# Patient Record
Sex: Female | Born: 1970 | Race: Black or African American | Hispanic: No | Marital: Single | Smoking: Never smoker
Health system: Southern US, Community
[De-identification: ages and names within clinical notes are randomized; demographics above are authoritative.]

## PROBLEM LIST (undated history)

## (undated) DIAGNOSIS — D219 Benign neoplasm of connective and other soft tissue, unspecified: Secondary | ICD-10-CM

## (undated) DIAGNOSIS — I341 Nonrheumatic mitral (valve) prolapse: Secondary | ICD-10-CM

## (undated) DIAGNOSIS — N920 Excessive and frequent menstruation with regular cycle: Secondary | ICD-10-CM

## (undated) HISTORY — PX: WISDOM TOOTH EXTRACTION: SHX21

## (undated) HISTORY — DX: Nonrheumatic mitral (valve) prolapse: I34.1

## (undated) HISTORY — PX: COLONOSCOPY: SHX174

## (undated) HISTORY — DX: Benign neoplasm of connective and other soft tissue, unspecified: D21.9

## (undated) HISTORY — DX: Excessive and frequent menstruation with regular cycle: N92.0

---

## 2012-07-17 LAB — HM COLONOSCOPY

## 2015-04-26 ENCOUNTER — Ambulatory Visit (INDEPENDENT_AMBULATORY_CARE_PROVIDER_SITE_OTHER): Payer: BLUE CROSS/BLUE SHIELD | Admitting: Family Medicine

## 2015-04-26 ENCOUNTER — Encounter: Payer: Self-pay | Admitting: Family Medicine

## 2015-04-26 VITALS — BP 108/66 | HR 98 | Temp 98.8°F | Resp 16 | Ht 70.0 in | Wt 158.3 lb

## 2015-04-26 DIAGNOSIS — H02823 Cysts of right eye, unspecified eyelid: Secondary | ICD-10-CM

## 2015-04-26 DIAGNOSIS — N920 Excessive and frequent menstruation with regular cycle: Secondary | ICD-10-CM | POA: Insufficient documentation

## 2015-04-26 DIAGNOSIS — Z0000 Encounter for general adult medical examination without abnormal findings: Secondary | ICD-10-CM | POA: Diagnosis not present

## 2015-04-26 DIAGNOSIS — M549 Dorsalgia, unspecified: Secondary | ICD-10-CM | POA: Insufficient documentation

## 2015-04-26 DIAGNOSIS — H02829 Cysts of unspecified eye, unspecified eyelid: Secondary | ICD-10-CM | POA: Insufficient documentation

## 2015-04-26 NOTE — Progress Notes (Signed)
Name: Abigail Massey   MRN: [id number]    DOB: [birth year]   Date:04/26/2015       Progress Note  Subjective  Chief Complaint  Chief Complaint  Patient presents with  . Establish Care    HPI  Pt. Is here for an annual physical without GYN exam.  She is doing well, no concerns voiced today.  Past Medical History  Diagnosis Date  . Mitral prolapse   . Fibroids   . Heavy menstrual bleeding     Past Surgical History  Procedure Laterality Date  . Colonoscopy    . Wisdom tooth extraction Bilateral     Family History  Problem Relation Age of Onset  . Glaucoma Mother   . Hypothyroidism Mother   . Healthy Father   . Healthy Sister     Social History   Social History  . Marital Status: Single    Spouse Name: N/A  . Number of Children: N/A  . Years of Education: N/A   Occupational History  . Not on file.   Social History Main Topics  . Smoking status: Never Smoker   . Smokeless tobacco: Not on file  . Alcohol Use: No  . Drug Use: No  . Sexual Activity: No   Other Topics Concern  . Not on file   Social History Narrative  . No narrative on file    Current outpatient prescriptions:  .  ascorbic acid (VITAMIN C) 500 MG tablet, Take by mouth., Disp: , Rfl:  .  cyanocobalamin 1000 MCG tablet, Take by mouth., Disp: , Rfl:  .  ferrous sulfate (IRON SUPPLEMENT) 325 (65 FE) MG tablet, Take by mouth., Disp: , Rfl:  .  Multiple Vitamins-Minerals (MULTIVITAMIN WOMEN) TABS, Take by mouth., Disp: , Rfl:  .  Probiotic Product (SOLUBLE FIBER/PROBIOTICS) CHEW, Chew by mouth., Disp: , Rfl:  .  Vitamin D, Cholecalciferol, 1000 UNITS TABS, Take by mouth., Disp: , Rfl:   No Known Allergies   Review of Systems  Constitutional: Positive for malaise/fatigue. Negative for fever, chills and weight loss.  HENT: Positive for congestion (sinus congestion.). Negative for ear pain and sore throat.   Eyes: Negative for blurred vision and double vision.  Respiratory: Negative for  cough, sputum production and shortness of breath.   Cardiovascular: Negative for chest pain, palpitations and leg swelling.  Gastrointestinal: Negative for heartburn, nausea, vomiting, abdominal pain, diarrhea, constipation and blood in stool (pt. has history of internal hemorrhoids.).  Genitourinary: Positive for frequency. Negative for dysuria, urgency and hematuria.  Musculoskeletal: Negative for myalgias, back pain, joint pain and neck pain.  Skin: Negative for itching and rash.  Neurological: Negative for dizziness, sensory change and headaches.  Endo/Heme/Allergies: Does not bruise/bleed easily.  Psychiatric/Behavioral: Negative for depression. The patient is not nervous/anxious and does not have insomnia.    Objective  Filed Vitals:   04/26/15 1027  BP: 108/66  Pulse: 98  Temp: 98.8 F (37.1 C)  TempSrc: Oral  Resp: 16  Height: 5\' 10"  (1.778 m)  Weight: 158 lb 4.8 oz (71.804 kg)  SpO2: 98%   Physical Exam  Constitutional: She is oriented to person, place, and time and well-developed, well-nourished, and in no distress.  HENT:  Head: Normocephalic and atraumatic.  Nasal turbinates erythematous and hypertrophied.  Eyes: Conjunctivae and lids are normal. Pupils are equal, round, and reactive to light.  Mass on the lateral right lower eyelid, present for over 6 months.  Neck: Normal range of motion. Neck supple.  Cardiovascular:  Normal rate and regular rhythm.   No murmur heard. Pulmonary/Chest: Effort normal and breath sounds normal. She has no wheezes.  Abdominal: Soft. Bowel sounds are normal. There is no tenderness.  Genitourinary:  Deferred. Follows up with GYN.  Neurological: She is alert and oriented to person, place, and time.  Skin: Skin is warm and dry.  Psychiatric: Mood, memory, affect and judgment normal.  Nursing note and vitals reviewed.  Assessment & Plan  1. Annual physical exam Pain screening lab work. Patient  Follows up with gynecology for Pap  smear. - CBC with Differential - Comprehensive Metabolic Panel (CMET) - TSH - Vitamin D (25 hydroxy) - Lipid Profile  2. Eyelid cyst, right  - Ambulatory referral to Ophthalmology    [facility] 04/26/2015 11:15 AM

## 2015-04-30 LAB — CBC WITH DIFFERENTIAL/PLATELET
Basophils Absolute: 0 10*3/uL (ref 0.0–0.2)
Basos: 0 %
EOS (ABSOLUTE): 0.1 10*3/uL (ref 0.0–0.4)
Eos: 3 %
Hematocrit: 38.8 % (ref 34.0–46.6)
Hemoglobin: 12.7 g/dL (ref 11.1–15.9)
Immature Grans (Abs): 0 10*3/uL (ref 0.0–0.1)
Immature Granulocytes: 0 %
Lymphocytes Absolute: 1 10*3/uL (ref 0.7–3.1)
Lymphs: 20 %
MCH: 27.2 pg (ref 26.6–33.0)
MCHC: 32.7 g/dL (ref 31.5–35.7)
MCV: 83 fL (ref 79–97)
Monocytes Absolute: 0.5 10*3/uL (ref 0.1–0.9)
Monocytes: 10 %
Neutrophils Absolute: 3.5 10*3/uL (ref 1.4–7.0)
Neutrophils: 67 %
Platelets: 259 10*3/uL (ref 150–379)
RBC: 4.67 x10E6/uL (ref 3.77–5.28)
RDW: 13.6 % (ref 12.3–15.4)
WBC: 5.2 10*3/uL (ref 3.4–10.8)

## 2015-04-30 LAB — COMPREHENSIVE METABOLIC PANEL
ALT: 26 IU/L (ref 0–32)
AST: 22 IU/L (ref 0–40)
Albumin/Globulin Ratio: 1.7 (ref 1.1–2.5)
Albumin: 4.3 g/dL (ref 3.5–5.5)
Alkaline Phosphatase: 54 IU/L (ref 39–117)
BUN/Creatinine Ratio: 11 (ref 9–23)
BUN: 9 mg/dL (ref 6–24)
Bilirubin Total: 0.5 mg/dL (ref 0.0–1.2)
CO2: 22 mmol/L (ref 18–29)
Calcium: 8.8 mg/dL (ref 8.7–10.2)
Chloride: 102 mmol/L (ref 97–106)
Creatinine, Ser: 0.83 mg/dL (ref 0.57–1.00)
GFR calc Af Amer: 101 mL/min/{1.73_m2} (ref >59)
GFR calc non Af Amer: 87 mL/min/{1.73_m2} (ref >59)
Globulin, Total: 2.6 g/dL (ref 1.5–4.5)
Glucose: 77 mg/dL (ref 65–99)
Potassium: 4.3 mmol/L (ref 3.5–5.2)
Sodium: 139 mmol/L (ref 136–144)
Total Protein: 6.9 g/dL (ref 6.0–8.5)

## 2015-04-30 LAB — VITAMIN D 25 HYDROXY (VIT D DEFICIENCY, FRACTURES): Vit D, 25-Hydroxy: 30.3 ng/mL (ref 30.0–100.0)

## 2015-04-30 LAB — LIPID PANEL
Chol/HDL Ratio: 2.9 ratio units (ref 0.0–4.4)
Cholesterol, Total: 138 mg/dL (ref 100–199)
HDL: 47 mg/dL (ref >39)
LDL Calculated: 81 mg/dL (ref 0–99)
Triglycerides: 52 mg/dL (ref 0–149)
VLDL Cholesterol Cal: 10 mg/dL (ref 5–40)

## 2015-04-30 LAB — TSH: TSH: 1.13 u[IU]/mL (ref 0.450–4.500)

## 2015-05-10 MED ORDER — FAMOTIDINE 20 MG PO TABS
ORAL_TABLET | ORAL | Status: AC
  Filled 2015-05-10: qty 1

## 2015-05-10 MED ORDER — CEFAZOLIN SODIUM-DEXTROSE 2-3 GM-% IV SOLR
INTRAVENOUS | Status: AC
  Filled 2015-05-10: qty 50

## 2015-05-13 ENCOUNTER — Encounter
Admission: RE | Admit: 2015-05-13 | Discharge: 2015-05-13 | Disposition: A | Discharge status: Home / Self Care | Payer: BLUE CROSS/BLUE SHIELD | Source: Ambulatory Visit | Attending: Obstetrics & Gynecology | Admitting: Obstetrics & Gynecology

## 2015-05-13 DIAGNOSIS — Z01812 Encounter for preprocedural laboratory examination: Secondary | ICD-10-CM | POA: Insufficient documentation

## 2015-05-13 LAB — TYPE AND SCREEN
ABO/RH(D): A POS
Antibody Screen: NEGATIVE

## 2015-05-13 LAB — ABO/RH: ABO/RH(D): A POS

## 2015-05-13 NOTE — Patient Instructions (Signed)
  Your procedure is scheduled on:05/18/15 Report to Day Surgery. To find out your arrival time please call [phone] between 1PM - 3PM on 05/17/15.  Remember: Instructions that are not followed completely may result in serious medical risk, up to and including death, or upon the discretion of your surgeon and anesthesiologist your surgery may need to be rescheduled.    _x___ 1. Do not eat food or drink liquids after midnight. No gum chewing or hard candies.     __x_ 2. No Alcohol for 24 hours before or after surgery.   ____ 3. Bring all medications with you on the day of surgery if instructed.    __x__ 4. Notify your doctor if there is any change in your medical condition     (cold, fever, infections).     Do not wear jewelry, make-up, hairpins, clips or nail polish.  Do not wear lotions, powders, or perfumes. You may wear deodorant.  Do not shave 48 hours prior to surgery. Men may shave face and neck.  Do not bring valuables to the hospital.    [facility] is not responsible for any belongings or valuables.               Contacts, dentures or bridgework may not be worn into surgery.  Leave your suitcase in the car. After surgery it may be brought to your room.  For patients admitted to the hospital, discharge time is determined by your                treatment team.   Patients discharged the day of surgery will not be allowed to drive home.   Please read over the following fact sheets that you were given:   Surgical Site Infection Prevention   ____ Take these medicines the morning of surgery with A SIP OF WATER:    1.   2.   3.   4.  5.  6.  ____ Fleet Enema (as directed)   _x___ Use CHG Soap as directed  ____ Use inhalers on the day of surgery  ____ Stop metformin 2 days prior to surgery    ____ Take 1/2 of usual insulin dose the night before surgery and none on the morning of surgery.   ____ Stop Coumadin/Plavix/aspirin on   _x___ Stop Anti-inflammatories on  today   _x___ Stop supplements until after surgery.  Vit C today  ____ Bring C-Pap to the hospital.

## 2015-05-18 ENCOUNTER — Ambulatory Visit: Payer: BLUE CROSS/BLUE SHIELD | Admitting: Anesthesiology

## 2015-05-18 ENCOUNTER — Encounter
Admission: RE | Disposition: A | Discharge status: Home / Self Care | Payer: Self-pay | Source: Ambulatory Visit | Attending: Obstetrics & Gynecology

## 2015-05-18 ENCOUNTER — Encounter: Payer: Self-pay | Admitting: *Deleted

## 2015-05-18 ENCOUNTER — Observation Stay
Admission: RE | Admit: 2015-05-18 | Discharge: 2015-05-19 | Disposition: A | Discharge status: Home / Self Care | Payer: BLUE CROSS/BLUE SHIELD | Source: Ambulatory Visit | Attending: Obstetrics & Gynecology | Admitting: Obstetrics & Gynecology

## 2015-05-18 DIAGNOSIS — D259 Leiomyoma of uterus, unspecified: Principal | ICD-10-CM | POA: Diagnosis present

## 2015-05-18 DIAGNOSIS — Z9889 Other specified postprocedural states: Secondary | ICD-10-CM | POA: Insufficient documentation

## 2015-05-18 DIAGNOSIS — N920 Excessive and frequent menstruation with regular cycle: Secondary | ICD-10-CM | POA: Diagnosis present

## 2015-05-18 DIAGNOSIS — N921 Excessive and frequent menstruation with irregular cycle: Secondary | ICD-10-CM | POA: Diagnosis present

## 2015-05-18 DIAGNOSIS — Z90710 Acquired absence of both cervix and uterus: Secondary | ICD-10-CM | POA: Diagnosis present

## 2015-05-18 DIAGNOSIS — N72 Inflammatory disease of cervix uteri: Secondary | ICD-10-CM | POA: Diagnosis not present

## 2015-05-18 DIAGNOSIS — N888 Other specified noninflammatory disorders of cervix uteri: Secondary | ICD-10-CM | POA: Insufficient documentation

## 2015-05-18 DIAGNOSIS — Z79899 Other long term (current) drug therapy: Secondary | ICD-10-CM | POA: Diagnosis not present

## 2015-05-18 HISTORY — PX: LAPAROSCOPIC HYSTERECTOMY: SHX1926

## 2015-05-18 HISTORY — PX: ABDOMINAL HYSTERECTOMY: SHX81

## 2015-05-18 LAB — POCT PREGNANCY, URINE: Preg Test, Ur: NEGATIVE

## 2015-05-18 SURGERY — HYSTERECTOMY, TOTAL, LAPAROSCOPIC
Anesthesia: General

## 2015-05-18 MED ORDER — LIDOCAINE HCL (CARDIAC) 20 MG/ML IV SOLN
INTRAVENOUS | Status: DC | PRN
  Administered 2015-05-18: 80 mg via INTRAVENOUS

## 2015-05-18 MED ORDER — OXYCODONE-ACETAMINOPHEN 5-325 MG PO TABS: 1.0000 | ORAL_TABLET | ORAL | Status: DC | PRN

## 2015-05-18 MED ORDER — ACETAMINOPHEN 10 MG/ML IV SOLN
INTRAVENOUS | Status: AC
  Filled 2015-05-18: qty 100

## 2015-05-18 MED ORDER — ONDANSETRON HCL 4 MG/2ML IJ SOLN: 4.0000 mg | Freq: Once | INTRAMUSCULAR | Status: DC | PRN

## 2015-05-18 MED ORDER — KETOROLAC TROMETHAMINE 30 MG/ML IJ SOLN
INTRAMUSCULAR | Status: AC
  Administered 2015-05-18: 30 mg via INTRAVENOUS
  Filled 2015-05-18: qty 1

## 2015-05-18 MED ORDER — LACTATED RINGERS IV SOLN
INTRAVENOUS | Status: DC
  Administered 2015-05-18 (×2): via INTRAVENOUS

## 2015-05-18 MED ORDER — FAMOTIDINE 20 MG PO TABS
ORAL_TABLET | ORAL | Status: AC
  Filled 2015-05-18: qty 1

## 2015-05-18 MED ORDER — BUPIVACAINE HCL (PF) 0.5 % IJ SOLN
INTRAMUSCULAR | Status: DC | PRN
  Administered 2015-05-18: 13 mL

## 2015-05-18 MED ORDER — KETOROLAC TROMETHAMINE 30 MG/ML IJ SOLN
30.0000 mg | Freq: Four times a day (QID) | INTRAMUSCULAR | Status: DC
Start: 2015-05-18 — End: 2015-05-19
  Administered 2015-05-18 – 2015-05-19 (×3): 30 mg via INTRAVENOUS
  Filled 2015-05-18 (×3): qty 1

## 2015-05-18 MED ORDER — BUPIVACAINE HCL (PF) 0.5 % IJ SOLN
INTRAMUSCULAR | Status: AC
  Filled 2015-05-18: qty 30

## 2015-05-18 MED ORDER — SIMETHICONE 80 MG PO CHEW: 80.0000 mg | CHEWABLE_TABLET | Freq: Four times a day (QID) | ORAL | Status: DC | PRN

## 2015-05-18 MED ORDER — DOCUSATE SODIUM 100 MG PO CAPS
100.0000 mg | ORAL_CAPSULE | Freq: Two times a day (BID) | ORAL | Status: DC
  Administered 2015-05-18 – 2015-05-19 (×2): 100 mg via ORAL
  Filled 2015-05-18 (×2): qty 1

## 2015-05-18 MED ORDER — KETOROLAC TROMETHAMINE 30 MG/ML IJ SOLN
30.0000 mg | Freq: Four times a day (QID) | INTRAMUSCULAR | Status: DC
  Administered 2015-05-18: 30 mg via INTRAVENOUS

## 2015-05-18 MED ORDER — FAMOTIDINE 20 MG PO TABS
20.0000 mg | ORAL_TABLET | Freq: Once | ORAL | Status: AC
  Administered 2015-05-18: 20 mg via ORAL

## 2015-05-18 MED ORDER — CEFOXITIN SODIUM-DEXTROSE 2-2.2 GM-% IV SOLR (PREMIX)
2.0000 g | Freq: Once | INTRAVENOUS | Status: AC
  Administered 2015-05-18: 2000 mg via INTRAVENOUS

## 2015-05-18 MED ORDER — FENTANYL CITRATE (PF) 250 MCG/5ML IJ SOLN
INTRAMUSCULAR | Status: DC | PRN
Start: 2015-05-18 — End: 2015-05-18
  Administered 2015-05-18: 50 ug via INTRAVENOUS
  Administered 2015-05-18 (×2): 100 ug via INTRAVENOUS
  Administered 2015-05-18 (×2): 50 ug via INTRAVENOUS

## 2015-05-18 MED ORDER — ONDANSETRON HCL 4 MG/2ML IJ SOLN: 4.0000 mg | Freq: Four times a day (QID) | INTRAMUSCULAR | Status: DC | PRN

## 2015-05-18 MED ORDER — SUGAMMADEX SODIUM 200 MG/2ML IV SOLN
INTRAVENOUS | Status: DC | PRN
  Administered 2015-05-18: 140.6 mg via INTRAVENOUS

## 2015-05-18 MED ORDER — MIDAZOLAM HCL 5 MG/5ML IJ SOLN
INTRAMUSCULAR | Status: DC | PRN
Start: 2015-05-18 — End: 2015-05-18
  Administered 2015-05-18: 2 mg via INTRAVENOUS

## 2015-05-18 MED ORDER — ONDANSETRON HCL 4 MG PO TABS: 4.0000 mg | ORAL_TABLET | Freq: Four times a day (QID) | ORAL | Status: DC | PRN

## 2015-05-18 MED ORDER — DEXAMETHASONE SODIUM PHOSPHATE 10 MG/ML IJ SOLN
INTRAMUSCULAR | Status: DC | PRN
  Administered 2015-05-18: 10 mg via INTRAVENOUS

## 2015-05-18 MED ORDER — LACTATED RINGERS IV SOLN
INTRAVENOUS | Status: DC
  Administered 2015-05-18 (×2): via INTRAVENOUS

## 2015-05-18 MED ORDER — FENTANYL CITRATE (PF) 100 MCG/2ML IJ SOLN
25.0000 ug | INTRAMUSCULAR | Status: DC | PRN
  Administered 2015-05-18 (×3): 25 ug via INTRAVENOUS

## 2015-05-18 MED ORDER — ONDANSETRON HCL 4 MG/2ML IJ SOLN
INTRAMUSCULAR | Status: DC | PRN
  Administered 2015-05-18: 4 mg via INTRAVENOUS

## 2015-05-18 MED ORDER — FENTANYL CITRATE (PF) 100 MCG/2ML IJ SOLN
INTRAMUSCULAR | Status: AC
  Administered 2015-05-18: 25 ug via INTRAVENOUS
  Filled 2015-05-18: qty 2

## 2015-05-18 MED ORDER — CEFOXITIN SODIUM-DEXTROSE 2-2.2 GM-% IV SOLR (PREMIX)
INTRAVENOUS | Status: AC
  Administered 2015-05-18: 2 g via INTRAVENOUS
  Filled 2015-05-18: qty 50

## 2015-05-18 MED ORDER — MORPHINE SULFATE (PF) 2 MG/ML IV SOLN: 1.0000 mg | INTRAVENOUS | Status: DC | PRN

## 2015-05-18 MED ORDER — ACETAMINOPHEN 10 MG/ML IV SOLN
INTRAVENOUS | Status: DC | PRN
Start: 2015-05-18 — End: 2015-05-18
  Administered 2015-05-18: 1000 mg via INTRAVENOUS

## 2015-05-18 MED ORDER — ACETAMINOPHEN 325 MG PO TABS: 650.0000 mg | ORAL_TABLET | ORAL | Status: DC | PRN

## 2015-05-18 MED ORDER — ROCURONIUM BROMIDE 100 MG/10ML IV SOLN
INTRAVENOUS | Status: DC | PRN
  Administered 2015-05-18: 20 mg via INTRAVENOUS
  Administered 2015-05-18: 50 mg via INTRAVENOUS

## 2015-05-18 MED ORDER — PROPOFOL 10 MG/ML IV BOLUS
INTRAVENOUS | Status: DC | PRN
  Administered 2015-05-18: 150 mg via INTRAVENOUS

## 2015-05-18 SURGICAL SUPPLY — 55 items
BAG URO DRAIN 2000ML W/SPOUT (MISCELLANEOUS) ×2 IMPLANT
BLADE SURG SZ11 CARB STEEL (BLADE) ×2 IMPLANT
CANISTER SUCT 1200ML W/VALVE (MISCELLANEOUS) ×2 IMPLANT
CATH FOLEY 2WAY  5CC 16FR (CATHETERS) ×1
CATH URTH 16FR FL 2W BLN LF (CATHETERS) ×1 IMPLANT
CHLORAPREP W/TINT 26ML (MISCELLANEOUS) ×2 IMPLANT
COVER LIGHT HANDLE STERIS (MISCELLANEOUS) ×2 IMPLANT
DEFOGGER SCOPE WARMER CLEARIFY (MISCELLANEOUS) ×2 IMPLANT
DEVICE SUTURE ENDOST 10MM (ENDOMECHANICALS) ×2 IMPLANT
DRSG TEGADERM 2-3/8X2-3/4 SM (GAUZE/BANDAGES/DRESSINGS) IMPLANT
ENDOSTITCH 0 SINGLE 48 (SUTURE) ×10 IMPLANT
GAUZE SPONGE NON-WVN 2X2 STRL (MISCELLANEOUS) IMPLANT
GLOVE BIO SURGEON STRL SZ 6.5 (GLOVE) ×2 IMPLANT
GLOVE BIO SURGEON STRL SZ8 (GLOVE) ×14 IMPLANT
GLOVE INDICATOR 8.0 STRL GRN (GLOVE) ×14 IMPLANT
GOWN STRL REUS W/ TWL LRG LVL3 (GOWN DISPOSABLE) ×3 IMPLANT
GOWN STRL REUS W/ TWL XL LVL3 (GOWN DISPOSABLE) ×4 IMPLANT
GOWN STRL REUS W/TWL LRG LVL3 (GOWN DISPOSABLE) ×3
GOWN STRL REUS W/TWL XL LVL3 (GOWN DISPOSABLE) ×4
IRRIGATION STRYKERFLOW (MISCELLANEOUS) ×1 IMPLANT
IRRIGATOR STRYKERFLOW (MISCELLANEOUS) ×2
IV LACTATED RINGERS 1000ML (IV SOLUTION) ×2 IMPLANT
KIT PINK PAD W/HEAD ARE REST (MISCELLANEOUS) ×2
KIT PINK PAD W/HEAD ARM REST (MISCELLANEOUS) ×1 IMPLANT
KIT RM TURNOVER CYSTO AR (KITS) ×2 IMPLANT
LABEL OR SOLS (LABEL) ×2 IMPLANT
LIQUID BAND (GAUZE/BANDAGES/DRESSINGS) ×4 IMPLANT
MANIPULATOR VCARE LG CRV RETR (MISCELLANEOUS) ×2 IMPLANT
MANIPULATOR VCARE STD CRV RETR (MISCELLANEOUS) IMPLANT
MORCELLATOR XCISE  COR (MISCELLANEOUS) ×3
MORCELLATOR XCISE COR (MISCELLANEOUS) ×3 IMPLANT
NEEDLE VERESS 14GA 120MM (NEEDLE) ×2 IMPLANT
NS IRRIG 500ML POUR BTL (IV SOLUTION) ×2 IMPLANT
OCCLUDER COLPOPNEUMO (BALLOONS) ×2 IMPLANT
PACK GYN LAPAROSCOPIC (MISCELLANEOUS) ×2 IMPLANT
PAD OB MATERNITY 4.3X12.25 (PERSONAL CARE ITEMS) ×2 IMPLANT
PAD PREP 24X41 OB/GYN DISP (PERSONAL CARE ITEMS) ×2 IMPLANT
RELOAD ENDO STITCH 2.0 (ENDOMECHANICALS) ×8
SCISSORS METZENBAUM CVD 33 (INSTRUMENTS) ×2 IMPLANT
SET CYSTO W/LG BORE CLAMP LF (SET/KITS/TRAYS/PACK) ×2 IMPLANT
SHEARS HARMONIC ACE PLUS 36CM (ENDOMECHANICALS) ×2 IMPLANT
SLEEVE ENDOPATH XCEL 5M (ENDOMECHANICALS) ×4 IMPLANT
SPONGE LAP 18X18 5 PK (GAUZE/BANDAGES/DRESSINGS) ×2 IMPLANT
SPONGE VERSALON 2X2 STRL (MISCELLANEOUS)
SUT RELOAD ENDO STITCH 2 48X1 (ENDOMECHANICALS) ×8
SUT VIC AB 0 CT1 36 (SUTURE) ×2 IMPLANT
SUT VIC AB 2-0 UR6 27 (SUTURE) IMPLANT
SUT VIC AB 4-0 SH 27 (SUTURE) ×1
SUT VIC AB 4-0 SH 27XANBCTRL (SUTURE) ×1 IMPLANT
SUTURE RELOAD END STTCH 2 48X1 (ENDOMECHANICALS) ×8 IMPLANT
SYR 50ML LL SCALE MARK (SYRINGE) ×2 IMPLANT
SYRINGE 10CC LL (SYRINGE) ×2 IMPLANT
TROCAR ENDO BLADELESS 11MM (ENDOMECHANICALS) ×2 IMPLANT
TROCAR XCEL NON-BLD 5MMX100MML (ENDOMECHANICALS) ×2 IMPLANT
TUBING INSUFFLATOR HEATED (MISCELLANEOUS) ×2 IMPLANT

## 2015-05-18 NOTE — Anesthesia Preprocedure Evaluation (Signed)
Anesthesia Evaluation  Patient identified by MRN, date of birth, ID band Patient awake    Airway Mallampati: I       Dental no notable dental hx.    Pulmonary neg pulmonary ROS,    Pulmonary exam normal        Cardiovascular Exercise Tolerance: Good  Rhythm:Regular Rate:Normal     Neuro/Psych    GI/Hepatic negative GI ROS, Neg liver ROS,   Endo/Other  negative endocrine ROS  Renal/GU negative Renal ROS     Musculoskeletal negative musculoskeletal ROS (+)   Abdominal (+) + scaphoid   Peds  Hematology negative hematology ROS (+)   Anesthesia Other Findings   Reproductive/Obstetrics negative OB ROS                             Anesthesia Physical Anesthesia Plan  ASA: II  Anesthesia Plan: General   Post-op Pain Management:    Induction: Intravenous  Airway Management Planned: Nasal Cannula  Additional Equipment:   Intra-op Plan:   Post-operative Plan:   Informed Consent: I have reviewed the patients History and Physical, chart, labs and discussed the procedure including the risks, benefits and alternatives for the proposed anesthesia with the patient or authorized representative who has indicated his/her understanding and acceptance.     Plan Discussed with: CRNA  Anesthesia Plan Comments:         Anesthesia Quick Evaluation

## 2015-05-18 NOTE — H&P (Signed)
History and Physical Interval Note:  05/18/2015 10:34 AM  Abigail Massey  has presented today for surgery, with the diagnosis of FIBROID UTERUS  The various methods of treatment have been discussed with the patient and family. After consideration of risks, benefits and other options for treatment, the patient has consented to  Procedure(s): HYSTERECTOMY TOTAL LAPAROSCOPIC/BILATERAL SALPINGECTOMY (N/A) as a surgical intervention for FIBROIDS an MENORRHAGIA.  The patient's history has been reviewed, patient examined, no change in status, stable for surgery.  Pt has the following beta blocker history-  Not taking Beta Blocker.  I have reviewed the patient's chart and labs.  Questions were answered to the patient's satisfaction.    Argie Applegate Eddie Dibbles

## 2015-05-18 NOTE — Anesthesia Procedure Notes (Signed)
Procedure Name: Intubation Date/Time: 05/18/2015 10:58 AM Performed by: Delaney Meigs Pre-anesthesia Checklist: Patient identified, Emergency Drugs available, Suction available, Patient being monitored and Timeout performed Patient Re-evaluated:Patient Re-evaluated prior to inductionOxygen Delivery Method: Circle system utilized Preoxygenation: Pre-oxygenation with 100% oxygen Intubation Type: IV induction Ventilation: Mask ventilation without difficulty Laryngoscope Size: Mac and 3 Grade View: Grade I Tube type: Oral Tube size: 7.0 mm Number of attempts: 1 Airway Equipment and Method: Stylet Placement Confirmation: ETT inserted through vocal cords under direct vision,  positive ETCO2 and breath sounds checked- equal and bilateral Secured at: 22 cm Tube secured with: Tape Dental Injury: Teeth and Oropharynx as per pre-operative assessment

## 2015-05-18 NOTE — Op Note (Signed)
Operative Note:  PRE-OP DIAGNOSIS: FIBROID UTERUS   POST-OP DIAGNOSIS: FIBROID UTERUS   PROCEDURE: Procedure(s): HYSTERECTOMY TOTAL LAPAROSCOPIC/BILATERAL SALPINGECTOMY  SURGEON: Barnett Applebaum, MD, FACOG  ASSISTANT: Dr Leonides Schanz   ANESTHESIA: General endotracheal anesthesia  ESTIMATED BLOOD LOSS: 613ml  SPECIMENS: Uterus, Tubes.  COMPLICATIONS: None  DISPOSITION: stable to PACU  FINDINGS: Intraabdominal adhesions were not noted. Large fibroids of uterus.  PROCEDURE:  The patient was taken to the OR where anesthesia was administed. She was prepped and draped in the normal sterile fashion in the dorsal lithotomy position in the [place] stirrups. A time out was performed. A Graves speculum was inserted, the cervix was grasped with a single tooth tenaculum and the endometrial cavity was sounded. The cervix was progressively dilated to a size 18 [place] with [employer] dilators. A V-Care uterine manipulator was inserted in the usual fashion without incident. Gloves were changed and attention was turned to the abdomen.   An infraumbilical transverse 16mm skin incision was made with the scalpel after local anesthesia applied to the skin. A Veress-step needle was inserted in the usual fashion and confirmed using the hanging drop technique. A pneumoperitoneum was obtained by insufflation of CO2 (opening pressure of 45mmHg) to 66mmHg. A diagnostic laparoscopy was performed yielding the previously described findings. A 32mm LUQ port was placed under visualization.  Attention was turned to the left lower quadrant where after visualization of the inferior epigastric vessels a 92mm skin incision was made with the scalpel. A 5 mm laparoscopic port was inserted. The same procedure was repeated in the right lower quadrant with a 34mm trocar. Attention was turned to the left aspect of the uterus, where after visualization of the ureter, the round ligament was coagulated and transected using the 31mm Harmonic Scapel. The  anterior and posterior leafs of the broad ligament were dissected off as the anterior one was coagulated and transected in a caudal direction towards the cuff of the uterine manipulator. The vesicouterine reflection of the peritoneum was dissected with the harmonic scapel and the bladder flap was created bluntly. Attention was then turned to the left fallopian tube which was recognized by visualization of the fimbria. First the mesosalpinx and then the utero-ovarian ligament were coagulated and transected using the Harmonic Scapel. Attention was turned to the right aspect of the uterus where the same procedure was performed. The uterine vessels were sealed and transected bilaterally using first bipolar cautery and then the harmonic scapel. A 360 degree, circumferential colpotomy was done to completely amputate the uterus with cervix and tubes. Once the specimen was amputated it was deemed unable to be delivered through the vagina.   The colpotomy was repaired in a simple interrupted fashion using a 0-Polysorb suture with an endo-stitch device.  Vaginal exam confirms complete closure.  The cavity was copiously irrigated. A survey of the pelvic cavity revealed adequate hemostasis and no injury to bowel, bladder, or ureter.  Morcellator device was placed in RLQ through prior port site and the uterus and fibroids were removed thru morcellation process without complication.  All tissue fragments are removed and pelvic cavity is irrigated.  RLQ incision fascia is closed with a 0 Vicryl suture.  A diagnostic cystoscopy was performed using saline distension of bladder, with bilateral urine flow from each ureteral orifice.  No bladder lesions or injuries seen.  Foley cathater replaced.   At this point the procedure was finalized. All the instruments were removed from the patient's body. Gas was expelled and patient is leveled.  Incisions are  closed with skin adhesive.  The RLQ incision is closed w 4-0 vicryl suture  and skin glue.  Patient goes to recovery room in stable condition.  All sponge, instrument, and needle counts are correct x2.

## 2015-05-18 NOTE — Progress Notes (Signed)
Day of Surgery Procedure(s) (LRB): HYSTERECTOMY TOTAL LAPAROSCOPIC/BILATERAL SALPINGECTOMY (N/A)  Subjective: Patient reports incisional pain and tolerating PO.    Objective: I have reviewed patient's vital signs, intake and output and medications.  Abd: Min T, ND Incision: clean, dry and intact Extr: no calf T, no edema  Assessment: s/p Procedure(s): HYSTERECTOMY TOTAL LAPAROSCOPIC/BILATERAL SALPINGECTOMY (N/A): stable  Plan: Advance diet Encourage ambulation Advance to PO medication   Abigail Massey PAUL 05/18/2015, 6:17 PM

## 2015-05-18 NOTE — Progress Notes (Signed)
To OR with BAIR PAWS, thermal cap

## 2015-05-18 NOTE — Transfer of Care (Signed)
Immediate Anesthesia Transfer of Care Note  Patient: Abigail Massey  Procedure(s) Performed: Procedure(s): HYSTERECTOMY TOTAL LAPAROSCOPIC/BILATERAL SALPINGECTOMY (N/A)  Patient Location: PACU  Anesthesia Type:General  Level of Consciousness: sedated  Airway & Oxygen Therapy: Patient Spontanous Breathing and Patient connected to face mask oxygen  Post-op Assessment: Report given to RN and Post -op Vital signs reviewed and stable  Post vital signs: Reviewed and stable  Last Vitals: 14:36 97hr 100% sat 16resp 119/62 97.7 temp Filed Vitals:   05/18/15 0802  BP: 144/73  Pulse: 94  Temp: 36.9 C  Resp: 16    Complications: No apparent anesthesia complications

## 2015-05-18 NOTE — Discharge Instructions (Signed)
Total Laparoscopic Hysterectomy, Care After °Refer to this sheet in the next few weeks. These instructions provide you with information on caring for yourself after your procedure. Your health care provider may also give you more specific instructions. Your treatment has been planned according to current medical practices, but problems sometimes occur. Call your health care provider if you have any problems or questions after your procedure. °WHAT TO EXPECT AFTER THE PROCEDURE °· Pain and bruising at the incision sites. You will be given pain medicine to control it. °· Menopausal symptoms such as hot flashes, night sweats, and insomnia if your ovaries were removed. °· Sore throat from the breathing tube that was inserted during surgery. °HOME CARE INSTRUCTIONS °· Only take over-the-counter or prescription medicines for pain, discomfort, or fever as directed by your health care provider.   °· Do not take aspirin. It can cause bleeding.   °· Do not drive when taking pain medicine.   °· Follow your health care provider's advice regarding diet, exercise, lifting, driving, and general activities.   °· Resume your usual diet as directed and allowed.   °· Get plenty of rest and sleep.   °· Do not douche, use tampons, or have sexual intercourse for at least 6 weeks, or until your health care provider gives you permission.   °· Change your bandages (dressings) as directed by your health care provider.   °· Monitor your temperature and notify your health care provider of a fever.   °· Take showers instead of baths for 2-3 weeks.   °· Do not drink alcohol until your health care provider gives you permission.   °· If you develop constipation, you may take a mild laxative with your health care provider's permission. Bran foods may help with constipation problems. Drinking enough fluids to keep your urine clear or pale yellow may help as well.   °· Try to have someone home with you for 1-2 weeks to help around the house.    °· Keep all of your follow-up appointments as directed by your health care provider.   °SEEK MEDICAL CARE IF: °· You have swelling, redness, or increasing pain around your incision sites.   °· You have pus coming from your incision.   °· You notice a bad smell coming from your incision.   °· Your incision breaks open.   °· You feel dizzy or lightheaded.   °· You have pain or bleeding when you urinate.   °· You have persistent diarrhea.   °· You have persistent nausea and vomiting.   °· You have abnormal vaginal discharge.   °· You have a rash.   °· You have any type of abnormal reaction or develop an allergy to your medicine.   °· You have poor pain control with your prescribed medicine.   °SEEK IMMEDIATE MEDICAL CARE IF: °· You have chest pain or shortness of breath. °· You have severe abdominal pain that is not relieved with pain medicine. °· You have pain or swelling in your legs. °MAKE SURE YOU: °· Understand these instructions. °· Will watch your condition. °· Will get help right away if you are not doing well or get worse. °  °This information is not intended to replace advice given to you by your health care provider. Make sure you discuss any questions you have with your health care provider. °  °Document Released: 04/09/2013 Document Revised: 06/24/2013 Document Reviewed: 04/09/2013 °Elsevier Interactive Patient Education ©2016 [employer] Inc. ° °

## 2015-05-18 NOTE — Anesthesia Postprocedure Evaluation (Signed)
  Anesthesia Post-op Note  Patient: Abigail Massey  Procedure(s) Performed: Procedure(s): HYSTERECTOMY TOTAL LAPAROSCOPIC/BILATERAL SALPINGECTOMY (N/A)  Anesthesia type:General  Patient location: PACU  Post pain: Pain level controlled  Post assessment: Post-op Vital signs reviewed, Patient's Cardiovascular Status Stable, Respiratory Function Stable, Patent Airway and No signs of Nausea or vomiting  Post vital signs: Reviewed and stable  Last Vitals:  Filed Vitals:   05/18/15 1536  BP: 115/71  Pulse: 86  Temp:   Resp: 17    Level of consciousness: awake, alert  and patient cooperative  Complications: No apparent anesthesia complications

## 2015-05-19 ENCOUNTER — Encounter: Payer: Self-pay | Admitting: Obstetrics & Gynecology

## 2015-05-19 DIAGNOSIS — D259 Leiomyoma of uterus, unspecified: Secondary | ICD-10-CM | POA: Diagnosis not present

## 2015-05-19 LAB — HEMOGLOBIN: Hemoglobin: 9.9 g/dL — ABNORMAL LOW (ref 12.0–16.0)

## 2015-05-19 NOTE — Progress Notes (Signed)
Patient discharged home. Vital signs stable, bleeding within normal limits, incisions within normal limits. Discharge instructions, prescriptions, and follow up appointment given to and reviewed with patient. Patient verbalized understanding, all questions answered. Escorted in wheelchair by auxiliary.

## 2015-05-19 NOTE — Discharge Summary (Signed)
  Gynecology Physician Postoperative Discharge Summary  Patient ID: Abigail Massey MRN: [id number] DOB/AGE: 1970-08-12 44 y.o.  Admit Date: 05/18/2015 Discharge Date: 05/19/2015  Preoperative Diagnoses: Fibroid Uterus, Menorrhagia.  Procedures: Procedure(s) (LRB): HYSTERECTOMY TOTAL LAPAROSCOPIC/BILATERAL SALPINGECTOMY (N/A)  Hospital Course:  Abigail Massey is a 44 y.o.  admitted for scheduled surgery.  She underwent the procedures as mentioned above, her operation was uncomplicated. For further details about surgery, please refer to the operative report. Patient had an uncomplicated postoperative course. By time of discharge on POD#1, her pain was controlled on oral pain medications; she was ambulating, voiding without difficulty, tolerating regular diet and passing flatus. She was deemed stable for discharge to home.   Discharge Exam: Blood pressure 125/63, pulse 83, temperature 99 F (37.2 C), temperature source Oral, resp. rate 17, height 5\' 10"  (1.778 m), weight 155 lb (70.308 kg), last menstrual period 05/03/2015, SpO2 99 %. General appearance: alert and no distress  Resp: clear to auscultation bilaterally  Cardio: regular rate and rhythm  GI: soft, non-tender; bowel sounds normal; no masses, no organomegaly.  Incision: C/D/I, no erythema, no drainage noted Pelvic: scant blood on pad  Extremities: extremities normal, atraumatic, no cyanosis or edema and Homans sign is negative, no sign of DVT  Discharged Condition: Stable  Disposition: Final discharge disposition not confirmed    Medication List    TAKE these medications        ascorbic acid 500 MG tablet  Commonly known as:  VITAMIN C  Take by mouth.     cyanocobalamin 1000 MCG tablet  Take by mouth.     IRON SUPPLEMENT 325 (65 FE) MG tablet  Generic drug:  ferrous sulfate  Take by mouth.     MULTIVITAMIN WOMEN Tabs  Take by mouth.     SOLUBLE FIBER/PROBIOTICS Chew  Chew by mouth.     Vitamin D  (Cholecalciferol) 1000 UNITS Tabs  Take by mouth.      Take Motrin or Percocet for pain (Rx already given).     Follow-up Information    Follow up with Hoyt Koch, MD. Go in 2 weeks.   Specialty:  Obstetrics and Gynecology   Why:  As Scheduled, For Post Op at [facility] information:   [address] [place] [address] [phone]      Barnett Applebaum, MD

## 2015-05-20 LAB — SURGICAL PATHOLOGY

## 2016-05-03 ENCOUNTER — Encounter: Payer: Self-pay | Admitting: Family Medicine

## 2016-05-03 ENCOUNTER — Ambulatory Visit (INDEPENDENT_AMBULATORY_CARE_PROVIDER_SITE_OTHER): Payer: BLUE CROSS/BLUE SHIELD | Admitting: Family Medicine

## 2016-05-03 DIAGNOSIS — R109 Unspecified abdominal pain: Secondary | ICD-10-CM

## 2016-05-03 DIAGNOSIS — Z0000 Encounter for general adult medical examination without abnormal findings: Secondary | ICD-10-CM | POA: Diagnosis not present

## 2016-05-03 NOTE — Progress Notes (Signed)
Name: Abigail Massey   MRN: [id number]    DOB: May 14, 1971   Date:05/03/2016       Progress Note  Subjective  Chief Complaint  Chief Complaint  Patient presents with  . Annual Exam    CPE    HPI  Pt. Presents for an Annual Physical Exam and completion of biometric screening. She is doing well. Has intermittent right lower back and flank pain especially after eating, sometimes worse with eating spicy foods.     Past Medical History:  Diagnosis Date  . Fibroids   . Heavy menstrual bleeding   . Mitral prolapse     Past Surgical History:  Procedure Laterality Date  . ABDOMINAL HYSTERECTOMY  05/18/2015  . COLONOSCOPY    . LAPAROSCOPIC HYSTERECTOMY N/A 05/18/2015   Procedure: HYSTERECTOMY TOTAL LAPAROSCOPIC/BILATERAL SALPINGECTOMY;  Surgeon: Gae Dry, MD;  Location: [facility] ORS;  Service: Gynecology;  Laterality: N/A;  . WISDOM TOOTH EXTRACTION Bilateral     Family History  Problem Relation Age of Onset  . Glaucoma Mother   . Hypothyroidism Mother   . Healthy Father   . Healthy Sister     Social History   Social History  . Marital status: Single    Spouse name: N/A  . Number of children: N/A  . Years of education: N/A   Occupational History  . Not on file.   Social History Main Topics  . Smoking status: Never Smoker  . Smokeless tobacco: Never Used  . Alcohol use No  . Drug use: No  . Sexual activity: No   Other Topics Concern  . Not on file   Social History Narrative  . No narrative on file     Current Outpatient Prescriptions:  .  ascorbic acid (VITAMIN C) 500 MG tablet, Take by mouth., Disp: , Rfl:  .  cyanocobalamin 1000 MCG tablet, Take by mouth., Disp: , Rfl:  .  Multiple Vitamins-Minerals (MULTIVITAMIN WOMEN) TABS, Take by mouth., Disp: , Rfl:  .  Probiotic Product (SOLUBLE FIBER/PROBIOTICS) CHEW, Chew by mouth., Disp: , Rfl:  .  Vitamin D, Cholecalciferol, 1000 UNITS TABS, Take by mouth., Disp: , Rfl:  .  ferrous sulfate (IRON  SUPPLEMENT) 325 (65 FE) MG tablet, Take by mouth., Disp: , Rfl:   No Known Allergies   Review of Systems  Constitutional: Negative for chills, fever, malaise/fatigue and weight loss.  HENT: Negative for congestion.   Eyes: Negative for blurred vision and double vision.  Respiratory: Negative for cough, sputum production and shortness of breath.   Cardiovascular: Negative for chest pain and palpitations.  Gastrointestinal: Negative for abdominal pain, blood in stool, constipation, diarrhea, nausea and vomiting.  Genitourinary: Negative for dysuria and hematuria.  Musculoskeletal: Positive for back pain. Negative for myalgias and neck pain.  Neurological: Negative for dizziness and headaches.  Psychiatric/Behavioral: Negative for depression. The patient is not nervous/anxious.     Objective  Vitals:   05/03/16 1136  BP: 117/65  Pulse: 83  Resp: 17  Temp: 98.2 F (36.8 C)  TempSrc: Oral  SpO2: 97%  Weight: 157 lb 3.2 oz (71.3 kg)  Height: 5\' 10"  (1.778 m)    Physical Exam  Constitutional: She is oriented to person, place, and time and well-developed, well-nourished, and in no distress.  HENT:  Head: Normocephalic and atraumatic.  Cardiovascular: Normal rate, regular rhythm and normal heart sounds.   No murmur heard. Pulmonary/Chest: Effort normal and breath sounds normal. She has no wheezes.  Abdominal: Soft. Bowel sounds  are normal. There is no tenderness. There is no guarding and no CVA tenderness.  Neurological: She is alert and oriented to person, place, and time.  Skin: Skin is warm, dry and intact.  Psychiatric: Mood, memory, affect and judgment normal.  Nursing note and vitals reviewed.   Assessment & Plan  1. Well woman exam without gynecological exam Obtain pertinent screening lab work - Lipid Profile - COMPLETE METABOLIC PANEL WITH GFR  2. Right flank pain Likely gas pain versus kidney stone. Obtain renal ultrasound and urinalysis. - Urinalysis, Routine  w reflex microscopic - US Renal; Future   Abigail Massey [facility] 05/03/2016 12:12 PM

## 2016-05-05 ENCOUNTER — Ambulatory Visit: Payer: BLUE CROSS/BLUE SHIELD

## 2016-05-09 DIAGNOSIS — Z0000 Encounter for general adult medical examination without abnormal findings: Secondary | ICD-10-CM | POA: Diagnosis not present

## 2016-05-09 DIAGNOSIS — R109 Unspecified abdominal pain: Secondary | ICD-10-CM | POA: Diagnosis not present

## 2016-05-09 LAB — COMPLETE METABOLIC PANEL WITH GFR
ALT: 16 U/L (ref 6–29)
AST: 15 U/L (ref 10–30)
Albumin: 4.4 g/dL (ref 3.6–5.1)
Alkaline Phosphatase: 46 U/L (ref 33–115)
BUN: 9 mg/dL (ref 7–25)
CO2: 23 mmol/L (ref 20–31)
Calcium: 9.2 mg/dL (ref 8.6–10.2)
Chloride: 105 mmol/L (ref 98–110)
Creat: 0.88 mg/dL (ref 0.50–1.10)
GFR, Est African American: >89 mL/min (ref >=60)
GFR, Est Non African American: 81 mL/min (ref >=60)
Glucose, Bld: 81 mg/dL (ref 65–99)
Potassium: 4.3 mmol/L (ref 3.5–5.3)
Sodium: 138 mmol/L (ref 135–146)
Total Bilirubin: 0.9 mg/dL (ref 0.2–1.2)
Total Protein: 7.1 g/dL (ref 6.1–8.1)

## 2016-05-09 LAB — LIPID PANEL
Cholesterol: 130 mg/dL (ref <200)
HDL: 47 mg/dL — ABNORMAL LOW (ref >50)
LDL Cholesterol: 71 mg/dL
Total CHOL/HDL Ratio: 2.8 Ratio (ref <5.0)
Triglycerides: 62 mg/dL (ref <150)
VLDL: 12 mg/dL (ref <30)

## 2016-05-10 LAB — URINALYSIS, ROUTINE W REFLEX MICROSCOPIC
Bilirubin Urine: NEGATIVE
Glucose, UA: NEGATIVE
Hgb urine dipstick: NEGATIVE
Ketones, ur: NEGATIVE
Leukocytes, UA: NEGATIVE
Nitrite: NEGATIVE
Protein, ur: NEGATIVE
Specific Gravity, Urine: 1.011 (ref 1.001–1.035)
pH: 6.5 (ref 5.0–8.0)

## 2016-05-12 ENCOUNTER — Other Ambulatory Visit: Payer: Self-pay

## 2016-06-02 ENCOUNTER — Other Ambulatory Visit: Payer: Self-pay | Admitting: Obstetrics & Gynecology

## 2016-06-02 DIAGNOSIS — Z01419 Encounter for gynecological examination (general) (routine) without abnormal findings: Secondary | ICD-10-CM | POA: Diagnosis not present

## 2016-06-02 DIAGNOSIS — Z1231 Encounter for screening mammogram for malignant neoplasm of breast: Secondary | ICD-10-CM

## 2016-07-11 ENCOUNTER — Ambulatory Visit: Payer: BLUE CROSS/BLUE SHIELD

## 2016-07-12 ENCOUNTER — Ambulatory Visit
Admission: RE | Admit: 2016-07-12 | Discharge: 2016-07-12 | Disposition: A | Discharge status: Home / Self Care | Payer: BLUE CROSS/BLUE SHIELD | Source: Ambulatory Visit | Attending: Obstetrics & Gynecology | Admitting: Obstetrics & Gynecology

## 2016-07-12 DIAGNOSIS — Z1231 Encounter for screening mammogram for malignant neoplasm of breast: Secondary | ICD-10-CM

## 2016-07-12 LAB — HM MAMMOGRAPHY

## 2016-07-17 ENCOUNTER — Ambulatory Visit
Admission: RE | Admit: 2016-07-17 | Discharge: 2016-07-17 | Disposition: A | Discharge status: Home / Self Care | Payer: BLUE CROSS/BLUE SHIELD | Source: Ambulatory Visit | Attending: Family Medicine | Admitting: Family Medicine

## 2016-07-17 DIAGNOSIS — N2882 Megaloureter: Secondary | ICD-10-CM | POA: Diagnosis not present

## 2016-07-17 DIAGNOSIS — R109 Unspecified abdominal pain: Secondary | ICD-10-CM

## 2016-07-17 DIAGNOSIS — N2889 Other specified disorders of kidney and ureter: Secondary | ICD-10-CM | POA: Diagnosis not present

## 2016-07-22 ENCOUNTER — Other Ambulatory Visit: Payer: Self-pay | Admitting: Family Medicine

## 2016-07-22 DIAGNOSIS — Z87442 Personal history of urinary calculi: Secondary | ICD-10-CM | POA: Insufficient documentation

## 2016-07-22 DIAGNOSIS — N2882 Megaloureter: Secondary | ICD-10-CM

## 2016-07-24 ENCOUNTER — Telehealth: Payer: Self-pay

## 2016-07-24 NOTE — Telephone Encounter (Signed)
This patient called stating that she did not understand why she was being sent to urology. I informed her that it was due to her imaging results and that Dr. Manuella Ghazi thinks that she should follow up with urology.  She stated that she got the results on MyChart but did not understand them. I then went over what was said by the radiologist and how meeting with the urologist will help figure out if this is something that needs to be addressed right away or if it will clear up on its own.  I also told her that Zara Council comes highly recommended and that she was very good and what she does.   She asked if it would be covered under her insurance and I told her I was not exactly sure but that it should be since we do have patient with [employer] that has been referred there before.  She said ok and thanks.

## 2016-07-27 ENCOUNTER — Inpatient Hospital Stay
Admission: RE | Admit: 2016-07-27 | Discharge: 2016-07-27 | Disposition: A | Discharge status: Home / Self Care | Payer: Self-pay | Source: Ambulatory Visit | Attending: *Deleted | Admitting: *Deleted

## 2016-07-27 ENCOUNTER — Other Ambulatory Visit: Payer: Self-pay | Admitting: *Deleted

## 2016-07-27 DIAGNOSIS — Z9289 Personal history of other medical treatment: Secondary | ICD-10-CM

## 2016-08-08 ENCOUNTER — Ambulatory Visit (INDEPENDENT_AMBULATORY_CARE_PROVIDER_SITE_OTHER): Payer: BLUE CROSS/BLUE SHIELD | Admitting: Urology

## 2016-08-08 ENCOUNTER — Encounter: Payer: Self-pay | Admitting: Urology

## 2016-08-08 VITALS — BP 110/68 | HR 109 | Ht 70.0 in | Wt 153.2 lb

## 2016-08-08 DIAGNOSIS — R3129 Other microscopic hematuria: Secondary | ICD-10-CM | POA: Diagnosis not present

## 2016-08-08 DIAGNOSIS — N1339 Other hydronephrosis: Secondary | ICD-10-CM | POA: Diagnosis not present

## 2016-08-08 LAB — URINALYSIS, COMPLETE
Bilirubin, UA: NEGATIVE
Glucose, UA: NEGATIVE
Ketones, UA: NEGATIVE
Leukocytes, UA: NEGATIVE
Nitrite, UA: NEGATIVE
Specific Gravity, UA: 1.025 (ref 1.005–1.030)
Urobilinogen, Ur: 0.2 mg/dL (ref 0.2–1.0)
pH, UA: 5.5 (ref 5.0–7.5)

## 2016-08-08 LAB — MICROSCOPIC EXAMINATION: Epithelial Cells (non renal): >10 /hpf — AB (ref 0–10)

## 2016-08-08 NOTE — Progress Notes (Signed)
08/08/2016 11:01 AM   Abigail Massey 1971/02/12 [id number]  Referring provider: Roselee Nova, MD [address] [place], [place] [address]  Chief Complaint  Patient presents with  . New Patient (Initial Visit)    Right Ureter dialated referred by Dr. Keith Rake    HPI: Patient is a 46 year old African American female who is referred to [place] by Dr. Manuella Ghazi for findings of right hydronephrosis on recent RUS.  RUS completed on 07/17/2016 noted mild dilatation of right renal collecting system and mild prominent proximal right ureter. Recent passed ureteral calculus or distal ureteral stricture cannot be excluded. Clinical correlation is necessary.  No left hydronephrosis or hydroureter.  Bilateral ureteral jets are visualized. Prevoid bladder measures 188 cc. Postvoid the bladder measures 24.8 cc.  I have independently reviewed the films.  She has no previous imaging for comparison.    Patient states that has had right flank pain on and off for many years.  She had passed it off as gas pain until last October when the pain become consistent.  She states the pain does not radiate.  5/10 pain.  Pain described as burning.  Nothing made the pain worse or better.   She states the pain has abated.  It has not been present for several weeks.    She does not have a history of nephrolithiasis.  No family history of stones.  No episodes of gross hematuria.  Baseline of nocturia x 1.  She has not had fevers, chills, nausea or vomiting.    Her UA today demonstrates 11-30 RBC's/hpf.      PMH: Past Medical History:  Diagnosis Date  . Fibroids   . Heavy menstrual bleeding   . Mitral prolapse     Surgical History: Past Surgical History:  Procedure Laterality Date  . ABDOMINAL HYSTERECTOMY  05/18/2015  . COLONOSCOPY    . LAPAROSCOPIC HYSTERECTOMY N/A 05/18/2015   Procedure: HYSTERECTOMY TOTAL LAPAROSCOPIC/BILATERAL SALPINGECTOMY;  Surgeon: Gae Dry, MD;  Location: [facility]  ORS;  Service: Gynecology;  Laterality: N/A;  . WISDOM TOOTH EXTRACTION Bilateral     Home Medications:  Allergies as of 08/08/2016   No Known Allergies     Medication List       Accurate as of 08/08/16 11:01 AM. Always use your most recent med list.          ascorbic acid 500 MG tablet Commonly known as:  VITAMIN C Take by mouth.   cyanocobalamin 1000 MCG tablet Take by mouth.   IRON SUPPLEMENT 325 (65 FE) MG tablet Generic drug:  ferrous sulfate Take by mouth.   MULTIVITAMIN WOMEN Tabs Take by mouth.   SOLUBLE FIBER/PROBIOTICS Chew Chew by mouth.   Vitamin D (Cholecalciferol) 1000 units Tabs Take by mouth.       Allergies: No Known Allergies  Family History: Family History  Problem Relation Age of Onset  . Glaucoma Mother   . Hypothyroidism Mother   . Healthy Father   . Healthy Sister   . Prostate cancer Paternal Uncle     x 2  . Kidney cancer Neg Hx   . Bladder Cancer Neg Hx     Social History:  reports that she has never smoked. She has never used smokeless tobacco. She reports that she does not drink alcohol or use drugs.  ROS: UROLOGY Frequent Urination?: No Hard to postpone urination?: No Burning/pain with urination?: No Get up at night to urinate?: Yes Leakage of  urine?: No Urine stream starts and stops?: No Trouble starting stream?: No Do you have to strain to urinate?: No Blood in urine?: No Urinary tract infection?: No Sexually transmitted disease?: No Injury to kidneys or bladder?: No Painful intercourse?: No Weak stream?: No Currently pregnant?: No Vaginal bleeding?: No Last menstrual period?: n  Gastrointestinal Nausea?: No Vomiting?: No Indigestion/heartburn?: No Diarrhea?: No Constipation?: No  Constitutional Fever: No Night sweats?: Yes Weight loss?: No Fatigue?: No  Skin Skin rash/lesions?: No Itching?: No  Eyes Blurred vision?: No Double vision?: No  Ears/Nose/Throat Sore throat?: No Sinus problems?:  No  Hematologic/Lymphatic Swollen glands?: No Easy bruising?: No  Cardiovascular Leg swelling?: No Chest pain?: No  Respiratory Cough?: No Shortness of breath?: No  Endocrine Excessive thirst?: No  Musculoskeletal Back pain?: No Joint pain?: No  Neurological Headaches?: No Dizziness?: No  Psychologic Depression?: No Anxiety?: No  Physical Exam: BP 110/68   Pulse (!) 109   Ht 5\' 10"  (1.778 m)   Wt 153 lb 3.2 oz (69.5 kg)   LMP 05/03/2015   BMI 21.98 kg/m   Constitutional: Well nourished. Alert and oriented, No acute distress. HEENT: [place] AT, moist mucus membranes. Trachea midline, no masses. Cardiovascular: No clubbing, cyanosis, or edema. Respiratory: Normal respiratory effort, no increased work of breathing. GI: Abdomen is soft, non tender, non distended, no abdominal masses. Liver and spleen not palpable.  No hernias appreciated.  Stool sample for occult testing is not indicated.   GU: No CVA tenderness.  No bladder fullness or masses.   Skin: No rashes, bruises or suspicious lesions. Lymph: No cervical or inguinal adenopathy. Neurologic: Grossly intact, no focal deficits, moving all 4 extremities. Psychiatric: Normal mood and affect.  Laboratory Data: Lab Results  Component Value Date   WBC 5.2 04/29/2015   HGB 9.9 (L) 05/19/2015   HCT 38.8 04/29/2015   MCV 83 04/29/2015   PLT 259 04/29/2015    Lab Results  Component Value Date   CREATININE 0.88 05/09/2016    Lab Results  Component Value Date   TSH 1.130 04/29/2015       Component Value Date/Time   CHOL 130 05/09/2016 0808   CHOL 138 04/29/2015 0949   HDL 47 (L) 05/09/2016 0808   HDL 47 04/29/2015 0949   CHOLHDL 2.8 05/09/2016 0808   VLDL 12 05/09/2016 0808   LDLCALC 71 05/09/2016 0808   LDLCALC 81 04/29/2015 0949    Lab Results  Component Value Date   AST 15 05/09/2016   Lab Results  Component Value Date   ALT 16 05/09/2016    Urinalysis 11-30 RBC's.  See EPIC.     Pertinent Imaging: CLINICAL DATA:  Intermittent right flank pain  EXAM: RENAL / URINARY TRACT ULTRASOUND COMPLETE  COMPARISON:  None.  FINDINGS: Right Kidney:  Length: 9.8 cm. Mild increase parenchymal echogenicity. Mild dilatation of right renal collecting system and mild prominent proximal right ureter. Recent passed ureteral calculus or distal ureteral stricture cannot be excluded. Clinical correlation is necessary.  Left Kidney:  Length: 11.7 cm. Echogenicity within normal limits. No mass or hydronephrosis visualized.  Bladder:  Appears normal for degree of bladder distention. Bilateral bilateral ureteral jets are visualized.  Prevoid bladder measures 188 cc. Postvoid the bladder measures 24.8 cc.  IMPRESSION: 1. Mild dilatation of right renal collecting system and mild prominent proximal right ureter. Recent passed ureteral calculus or distal ureteral stricture cannot be excluded. Clinical correlation is necessary. 2. No left hydronephrosis or hydroureter.  Bilateral ureteral jets  are visualized. Prevoid bladder measures 188 cc. Postvoid the bladder measures 24.8 cc.   Electronically Signed   By: Lahoma Crocker M.D.   On: 07/17/2016 08:26   Assessment & Plan:   1. Right hydronephrosis  - explained to the patient that since I do not have prior imaging, I could not be certain if this was a congenital finding or an acute finding  - recommended CT Urogram as patient has AMH  2. Microscopic hematuria  - I explained to the patient that there are a number of causes that can be associated with blood in the urine, such as stones, UTI's, damage to the urinary tract and/or cancer.  - At this time, I felt that the patient warranted further urologic evaluation.   The AUA guidelines state that a CT urogram is the preferred imaging study to evaluate hematuria.  - I explained to the patient that a contrast material will be injected into a vein and that in  rare instances, an allergic reaction can result and may even life threatening   The patient denies any allergies to contrast, iodine and/or seafood and is not taking metformin.  - Her reproductive status is hysterectomy  - Following the imaging study,  I've recommended a cystoscopy. I described how this is performed, typically in an office setting with a flexible cystoscope. We described the risks, benefits, and possible side effects, the most common of which is a minor amount of blood in the urine and/or burning which usually resolves in 24 to 48 hours.    - The patient had the opportunity to ask questions which were answered. Based upon this discussion, the patient is willing to proceed. Therefore, I've ordered: a CT Urogram and cystoscopy.  - The patient will return following all of the above for discussion of the results.     - Urinalysis, Complete  - urine culture  - BUN + creatinine   Return for CT report.  These notes generated with voice recognition software. I apologize for typographical errors.  Zara Council, [facility] [address], [place], [place] [address] [phone]

## 2016-08-09 LAB — BUN+CREAT
BUN/Creatinine Ratio: 16 (ref 9–23)
BUN: 13 mg/dL (ref 6–24)
Creatinine, Ser: 0.8 mg/dL (ref 0.57–1.00)
GFR calc Af Amer: 104 mL/min/{1.73_m2} (ref >59)
GFR calc non Af Amer: 90 mL/min/{1.73_m2} (ref >59)

## 2016-08-10 ENCOUNTER — Inpatient Hospital Stay
Admission: RE | Admit: 2016-08-10 | Discharge: 2016-08-10 | Disposition: A | Discharge status: Home / Self Care | Payer: Self-pay | Source: Ambulatory Visit | Attending: *Deleted | Admitting: *Deleted

## 2016-08-10 ENCOUNTER — Other Ambulatory Visit: Payer: Self-pay | Admitting: *Deleted

## 2016-08-10 DIAGNOSIS — Z9289 Personal history of other medical treatment: Secondary | ICD-10-CM

## 2016-08-10 LAB — CULTURE, URINE COMPREHENSIVE

## 2016-08-16 ENCOUNTER — Ambulatory Visit
Admission: RE | Admit: 2016-08-16 | Discharge: 2016-08-16 | Disposition: A | Discharge status: Home / Self Care | Payer: BLUE CROSS/BLUE SHIELD | Source: Ambulatory Visit | Attending: Urology | Admitting: Urology

## 2016-08-16 DIAGNOSIS — R3129 Other microscopic hematuria: Secondary | ICD-10-CM | POA: Insufficient documentation

## 2016-08-16 DIAGNOSIS — N200 Calculus of kidney: Secondary | ICD-10-CM | POA: Diagnosis not present

## 2016-08-16 MED ORDER — IOPAMIDOL (ISOVUE-300) INJECTION 61%
125.0000 mL | Freq: Once | INTRAVENOUS | Status: AC | PRN
  Administered 2016-08-16: 150 mL via INTRAVENOUS

## 2016-08-23 ENCOUNTER — Encounter: Payer: Self-pay | Admitting: Urology

## 2016-08-23 ENCOUNTER — Ambulatory Visit (INDEPENDENT_AMBULATORY_CARE_PROVIDER_SITE_OTHER): Payer: BLUE CROSS/BLUE SHIELD | Admitting: Urology

## 2016-08-23 VITALS — BP 121/77 | HR 74 | Ht 70.0 in | Wt 152.9 lb

## 2016-08-23 DIAGNOSIS — N132 Hydronephrosis with renal and ureteral calculous obstruction: Secondary | ICD-10-CM | POA: Diagnosis not present

## 2016-08-23 DIAGNOSIS — R3129 Other microscopic hematuria: Secondary | ICD-10-CM | POA: Diagnosis not present

## 2016-08-23 DIAGNOSIS — N200 Calculus of kidney: Secondary | ICD-10-CM

## 2016-08-24 LAB — MICROSCOPIC EXAMINATION: Bacteria, UA: NONE SEEN

## 2016-08-24 LAB — URINALYSIS, COMPLETE
Bilirubin, UA: NEGATIVE
Glucose, UA: NEGATIVE
Ketones, UA: NEGATIVE
Leukocytes, UA: NEGATIVE
Nitrite, UA: NEGATIVE
Protein, UA: NEGATIVE
RBC, UA: NEGATIVE
Specific Gravity, UA: <1.005 — ABNORMAL LOW (ref 1.005–1.030)
Urobilinogen, Ur: 0.2 mg/dL (ref 0.2–1.0)
pH, UA: 6 (ref 5.0–7.5)

## 2016-09-02 NOTE — Progress Notes (Signed)
08/23/2016 6:10 PM   Abigail Massey 17-Aug-1970 [id number]  Referring provider: Roselee Nova, MD [address] [place], [place] [address]  Chief Complaint  Patient presents with  . Hydronephrosis    HPI: 46 yo AAF who presents today to discuss her CT Urogram performed to evaluate for microscopic hematuria and right hydronephrosis seen on recent RUS.  Background Patient is a 46 year old African American female who is referred to [place] by Dr. Manuella Ghazi for findings of right hydronephrosis on recent RUS.  RUS completed on 07/17/2016 noted mild dilatation of right renal collecting system and mild prominent proximal right ureter. Recent passed ureteral calculus or distal ureteral stricture cannot be excluded. Clinical correlation is necessary.  No left hydronephrosis or hydroureter.  Bilateral ureteral jets are visualized. Prevoid bladder measures 188 cc. Postvoid the bladder measures 24.8 cc.  I have independently reviewed the films.  She has no previous imaging for comparison.  Patient states that has had right flank pain on and off for many years.  She had passed it off as gas pain until last October when the pain become consistent.  She states the pain does not radiate.  5/10 pain.  Pain described as burning.  Nothing made the pain worse or better.   She states the pain has abated.  It has not been present for several weeks.  She does not have a history of nephrolithiasis.  No family history of stones.  No episodes of gross hematuria.  Baseline of nocturia x 1.  She has not had fevers, chills, nausea or vomiting.  Her UA demonstrated 11-30 RBC's/hpf.    CT Urogram performed on 08/16/2016 noted a nonobstructing 2 mm right renal stone. No hydronephrosis. No ureteral or bladder stones.  No suspicious renal cortical masses.  No urothelial lesions.  I have independently reviewed the films and reviewed the films with the patient.  Today, she is not experiencing right flank pain. She  denied gross hematuria, dysuria and suprapubic pain. She denies fever, chills, nausea or vomiting.  UA today is unremarkable.    PMH: Past Medical History:  Diagnosis Date  . Fibroids   . Heavy menstrual bleeding   . Mitral prolapse     Surgical History: Past Surgical History:  Procedure Laterality Date  . ABDOMINAL HYSTERECTOMY  05/18/2015  . COLONOSCOPY    . LAPAROSCOPIC HYSTERECTOMY N/A 05/18/2015   Procedure: HYSTERECTOMY TOTAL LAPAROSCOPIC/BILATERAL SALPINGECTOMY;  Surgeon: Gae Dry, MD;  Location: [facility] ORS;  Service: Gynecology;  Laterality: N/A;  . WISDOM TOOTH EXTRACTION Bilateral     Home Medications:  Allergies as of 08/23/2016   No Known Allergies     Medication List       Accurate as of 08/23/16 11:59 PM. Always use your most recent med list.          ascorbic acid 500 MG tablet Commonly known as:  VITAMIN C Take by mouth.   cyanocobalamin 1000 MCG tablet Take by mouth.   MULTIVITAMIN WOMEN Tabs Take by mouth.   SOLUBLE FIBER/PROBIOTICS Chew Chew by mouth.   Vitamin D (Cholecalciferol) 1000 units Tabs Take by mouth.       Allergies: No Known Allergies  Family History: Family History  Problem Relation Age of Onset  . Glaucoma Mother   . Hypothyroidism Mother   . Healthy Father   . Healthy Sister   . Prostate cancer Paternal Uncle     x 2  . Kidney cancer Neg  Hx   . Bladder Cancer Neg Hx     Social History:  reports that she has never smoked. She has never used smokeless tobacco. She reports that she does not drink alcohol or use drugs.  ROS: UROLOGY Frequent Urination?: No Hard to postpone urination?: No Burning/pain with urination?: No Get up at night to urinate?: No Leakage of urine?: No Urine stream starts and stops?: No Trouble starting stream?: No Do you have to strain to urinate?: No Blood in urine?: No Urinary tract infection?: No Sexually transmitted disease?: No Injury to kidneys or bladder?: No Painful  intercourse?: No Weak stream?: No Currently pregnant?: No Vaginal bleeding?: No Last menstrual period?: n  Gastrointestinal Nausea?: No Vomiting?: No Indigestion/heartburn?: No Diarrhea?: No Constipation?: No  Constitutional Fever: No Night sweats?: Yes Weight loss?: No Fatigue?: No  Skin Skin rash/lesions?: No Itching?: No  Eyes Blurred vision?: No Double vision?: No  Ears/Nose/Throat Sore throat?: No Sinus problems?: No  Hematologic/Lymphatic Swollen glands?: No Easy bruising?: No  Cardiovascular Leg swelling?: No Chest pain?: No  Respiratory Cough?: No Shortness of breath?: No  Endocrine Excessive thirst?: No  Musculoskeletal Back pain?: No Joint pain?: No  Neurological Headaches?: No Dizziness?: No  Psychologic Depression?: No Anxiety?: No  Physical Exam: BP 121/77 (BP Location: Left Arm, Patient Position: Sitting, Cuff Size: Normal)   Pulse 74   Ht 5\' 10"  (1.778 m)   Wt 152 lb 14.4 oz (69.4 kg)   LMP 05/03/2015   BMI 21.94 kg/m   Constitutional: Well nourished. Alert and oriented, No acute distress. HEENT: [place] AT, moist mucus membranes. Trachea midline, no masses. Cardiovascular: No clubbing, cyanosis, or edema. Respiratory: Normal respiratory effort, no increased work of breathing. GI: Abdomen is soft, non tender, non distended, no abdominal masses. Liver and spleen not palpable.  No hernias appreciated.  Stool sample for occult testing is not indicated.   GU: No CVA tenderness.  No bladder fullness or masses.   Skin: No rashes, bruises or suspicious lesions. Lymph: No cervical or inguinal adenopathy. Neurologic: Grossly intact, no focal deficits, moving all 4 extremities. Psychiatric: Normal mood and affect.  Laboratory Data: Lab Results  Component Value Date   WBC 5.2 04/29/2015   HGB 9.9 (L) 05/19/2015   HCT 38.8 04/29/2015   MCV 83 04/29/2015   PLT 259 04/29/2015    Lab Results  Component Value Date   CREATININE 0.80  08/08/2016    Lab Results  Component Value Date   TSH 1.130 04/29/2015       Component Value Date/Time   CHOL 130 05/09/2016 0808   CHOL 138 04/29/2015 0949   HDL 47 (L) 05/09/2016 0808   HDL 47 04/29/2015 0949   CHOLHDL 2.8 05/09/2016 0808   VLDL 12 05/09/2016 0808   LDLCALC 71 05/09/2016 0808   LDLCALC 81 04/29/2015 0949    Lab Results  Component Value Date   AST 15 05/09/2016   Lab Results  Component Value Date   ALT 16 05/09/2016    Urinalysis Unremarkable.  See EPIC.    Pertinent Imaging: CLINICAL DATA:  Microhematuria.  Prior hysterectomy.  EXAM: CT ABDOMEN AND PELVIS WITHOUT AND WITH CONTRAST  TECHNIQUE: Multidetector CT imaging of the abdomen and pelvis was performed following the standard protocol before and following the bolus administration of intravenous contrast.  CONTRAST:  171mL ISOVUE-300 IOPAMIDOL (ISOVUE-300) INJECTION 61%  COMPARISON:  07/17/2016 renal sonogram.  FINDINGS: Lower chest: No significant pulmonary nodules or acute consolidative airspace disease.  Hepatobiliary: Normal liver  with no liver mass. Normal gallbladder with no radiopaque cholelithiasis. No biliary ductal dilatation.  Pancreas: Normal, with no mass or duct dilation.  Spleen: Normal size. No mass.  Adrenals/Urinary Tract: Normal adrenals. Nonobstructing 2 mm interpolar right renal stone. No additional right renal stones. No left renal stones. No hydronephrosis. Normal caliber ureters, with no ureteral stones. Hypodense 0.5 cm anterior upper right renal cortical lesion, too small to characterize, for which no further follow-up is required. No additional renal cortical masses. On delayed imaging, there is no urothelial wall thickening and there are no filling defects in the opacified portions of the bilateral collecting systems or ureters, noting limited opacification of the bilateral renal collecting systems and ureters, which limits evaluation. No  bladder stones. No bladder wall thickening. No bladder mass. No bladder diverticulum.  Stomach/Bowel: Grossly normal stomach. Normal caliber small bowel with no small bowel wall thickening. Normal appendix . Normal large bowel with no diverticulosis, large bowel wall thickening or pericolonic fat stranding.  Vascular/Lymphatic: Normal caliber abdominal aorta. Patent portal, splenic, hepatic and renal veins. No pathologically enlarged lymph nodes in the abdomen or pelvis.  Reproductive: Status post hysterectomy, with no abnormal findings at the vaginal cuff. No adnexal mass.  Other: No pneumoperitoneum, ascites or focal fluid collection.  Musculoskeletal: No aggressive appearing focal osseous lesions.  IMPRESSION: 1. Nonobstructing 2 mm right renal stone. No hydronephrosis. No ureteral or bladder stones. 2. No suspicious renal cortical masses.  No urothelial lesions.   Electronically Signed   By: Ilona Sorrel M.D.   On: 08/16/2016 21:10  Assessment & Plan:   1. Right hydronephrosis  - not seen on CT Urogram  - possibly the result of passage of an ureteral stone  2. Microscopic hematuria  - no worrisome lesions found on CT Urogram  - offered cystoscopy - patient deferred - advised of a 2% to 5% risk with microscopic hematuria of missing a bladder cancer - patient understands and accepts the risk  - will contact us with episodes of gross hematuria  - recheck UA in one year  3. Right renal stone  - RTC in one year for KUB  - Advised to contact our office or seek treatment in the ED if becomes febrile or pain/ vomiting are difficult control in order to arrange for emergent/urgent intervention   Return in about 1 year (around 08/23/2017) for KUB, UA and office visit.  These notes generated with voice recognition software. I apologize for typographical errors.  Zara Council, [facility] [address] [place], [place]  [address] [phone]

## 2017-05-16 ENCOUNTER — Encounter: Payer: Self-pay | Admitting: Family Medicine

## 2017-05-16 ENCOUNTER — Ambulatory Visit (INDEPENDENT_AMBULATORY_CARE_PROVIDER_SITE_OTHER): Payer: BLUE CROSS/BLUE SHIELD | Admitting: Family Medicine

## 2017-05-16 VITALS — BP 126/78 | HR 97 | Temp 98.5°F | Resp 14 | Ht 70.13 in | Wt 155.5 lb

## 2017-05-16 DIAGNOSIS — Z0000 Encounter for general adult medical examination without abnormal findings: Secondary | ICD-10-CM

## 2017-05-16 NOTE — Progress Notes (Signed)
Name: Abigail Massey   MRN: [id number]    DOB: October 08, 1970   Date:05/16/2017       Progress Note  Subjective  Chief Complaint  Chief Complaint  Patient presents with  . Annual Exam    HPI  Pt. Is here for Complete Physical Exam.  She is in good health, voices no concerns. She follows up with Gynecology, gets Pap smear and mammograms as recommended. She had her last colonoscopy in 2014.  Past Medical History:  Diagnosis Date  . Fibroids   . Heavy menstrual bleeding   . Mitral prolapse     Past Surgical History:  Procedure Laterality Date  . ABDOMINAL HYSTERECTOMY  05/18/2015  . COLONOSCOPY    . WISDOM TOOTH EXTRACTION Bilateral     Family History  Problem Relation Age of Onset  . Glaucoma Mother   . Hypothyroidism Mother   . Healthy Father   . Healthy Sister   . Prostate cancer Paternal Uncle        x 2  . Kidney cancer Neg Hx   . Bladder Cancer Neg Hx     Social History   Socioeconomic History  . Marital status: Single    Spouse name: Not on file  . Number of children: Not on file  . Years of education: Not on file  . Highest education level: Not on file  Social Needs  . Financial resource strain: Not on file  . Food insecurity - worry: Not on file  . Food insecurity - inability: Not on file  . Transportation needs - medical: Not on file  . Transportation needs - non-medical: Not on file  Occupational History  . Not on file  Tobacco Use  . Smoking status: Never Smoker  . Smokeless tobacco: Never Used  Substance and Sexual Activity  . Alcohol use: No    Alcohol/week: 0.0 oz  . Drug use: No  . Sexual activity: No  Other Topics Concern  . Not on file  Social History Narrative  . Not on file     Current Outpatient Medications:  .  ascorbic acid (VITAMIN C) 500 MG tablet, Take by mouth., Disp: , Rfl:  .  Multiple Vitamins-Minerals (MULTIVITAMIN WOMEN) TABS, Take by mouth., Disp: , Rfl:  .  Probiotic Product (SOLUBLE FIBER/PROBIOTICS) CHEW,  Chew by mouth., Disp: , Rfl:  .  Vitamin D, Cholecalciferol, 1000 UNITS TABS, Take by mouth., Disp: , Rfl:  .  cyanocobalamin 1000 MCG tablet, Take by mouth., Disp: , Rfl:   No Known Allergies   Review of Systems  Constitutional: Negative for chills, fever, malaise/fatigue and weight loss.  HENT: Positive for congestion and sinus pain. Negative for ear pain and sore throat.   Eyes: Negative for blurred vision and double vision.  Respiratory: Positive for sputum production. Negative for cough and shortness of breath.   Cardiovascular: Negative for chest pain, palpitations and leg swelling.  Gastrointestinal: Negative for abdominal pain, blood in stool, constipation, diarrhea, nausea and vomiting.  Genitourinary: Negative for dysuria, frequency and hematuria.  Musculoskeletal: Negative for back pain and neck pain.  Skin: Negative for rash.  Neurological: Negative for dizziness and headaches.  Psychiatric/Behavioral: Negative for depression. The patient is not nervous/anxious and does not have insomnia.       Objective  Vitals:   05/16/17 0900  BP: 126/78  Pulse: 97  Resp: 14  Temp: 98.5 F (36.9 C)  TempSrc: Oral  SpO2: 96%  Weight: 155 lb 8 oz (70.5 kg)  Height: 5' 10.13" (1.781 m)    Physical Exam  Constitutional: She is oriented to person, place, and time and well-developed, well-nourished, and in no distress.  HENT:  Head: Normocephalic and atraumatic.  Mouth/Throat: Oropharynx is clear and moist.  Eyes: Pupils are equal, round, and reactive to light.  Neck: Neck supple.  Cardiovascular: Normal rate, regular rhythm and normal heart sounds.  No murmur heard. Pulmonary/Chest: Effort normal and breath sounds normal. She has no wheezes.  Abdominal: Soft. Bowel sounds are normal. There is no tenderness.  Musculoskeletal: She exhibits no edema.  Neurological: She is alert and oriented to person, place, and time.  Psychiatric: Mood, memory, affect and judgment normal.   Nursing note and vitals reviewed.     Assessment & Plan  1. Well woman exam without gynecological exam Obtain age appropriate laboratory screenings - CBC with Differential/Platelet - COMPLETE METABOLIC PANEL WITH GFR - TSH - VITAMIN D 25 Hydroxy (Vit-D Deficiency, Fractures) - Lipid panel     Abigail Massey [facility] 05/16/2017 9:03 AM

## 2017-05-17 ENCOUNTER — Other Ambulatory Visit: Payer: Self-pay

## 2017-05-17 DIAGNOSIS — E559 Vitamin D deficiency, unspecified: Secondary | ICD-10-CM

## 2017-05-17 LAB — COMPLETE METABOLIC PANEL WITH GFR
AG Ratio: 1.5 (calc) (ref 1.0–2.5)
ALT: 33 U/L — ABNORMAL HIGH (ref 6–29)
AST: 24 U/L (ref 10–30)
Albumin: 4.4 g/dL (ref 3.6–5.1)
Alkaline phosphatase (APISO): 55 U/L (ref 33–115)
BUN: 9 mg/dL (ref 7–25)
CO2: 28 mmol/L (ref 20–32)
Calcium: 9 mg/dL (ref 8.6–10.2)
Chloride: 104 mmol/L (ref 98–110)
Creat: 0.68 mg/dL (ref 0.50–1.10)
GFR, Est African American: 123 mL/min/{1.73_m2} (ref >=60)
GFR, Est Non African American: 106 mL/min/{1.73_m2} (ref >=60)
Globulin: 2.9 g/dL (calc) (ref 1.9–3.7)
Glucose, Bld: 76 mg/dL (ref 65–99)
Potassium: 4.3 mmol/L (ref 3.5–5.3)
Sodium: 140 mmol/L (ref 135–146)
Total Bilirubin: 0.7 mg/dL (ref 0.2–1.2)
Total Protein: 7.3 g/dL (ref 6.1–8.1)

## 2017-05-17 LAB — CBC WITH DIFFERENTIAL/PLATELET
Basophils Absolute: 27 cells/uL (ref 0–200)
Basophils Relative: 0.4 %
Eosinophils Absolute: 127 cells/uL (ref 15–500)
Eosinophils Relative: 1.9 %
HCT: 42.5 % (ref 35.0–45.0)
Hemoglobin: 13.9 g/dL (ref 11.7–15.5)
Lymphs Abs: 1139 cells/uL (ref 850–3900)
MCH: 27.1 pg (ref 27.0–33.0)
MCHC: 32.7 g/dL (ref 32.0–36.0)
MCV: 83 fL (ref 80.0–100.0)
MPV: 11.2 fL (ref 7.5–12.5)
Monocytes Relative: 7.3 %
Neutro Abs: 4918 cells/uL (ref 1500–7800)
Neutrophils Relative %: 73.4 %
Platelets: 197 10*3/uL (ref 140–400)
RBC: 5.12 10*6/uL — ABNORMAL HIGH (ref 3.80–5.10)
RDW: 12.2 % (ref 11.0–15.0)
Total Lymphocyte: 17 %
WBC mixed population: 489 cells/uL (ref 200–950)
WBC: 6.7 10*3/uL (ref 3.8–10.8)

## 2017-05-17 LAB — VITAMIN D 25 HYDROXY (VIT D DEFICIENCY, FRACTURES): Vit D, 25-Hydroxy: 24 ng/mL — ABNORMAL LOW (ref 30–100)

## 2017-05-17 LAB — LIPID PANEL
Cholesterol: 143 mg/dL (ref <200)
HDL: 53 mg/dL (ref >50)
LDL Cholesterol (Calc): 76 mg/dL (calc)
Non-HDL Cholesterol (Calc): 90 mg/dL (calc) (ref <130)
Total CHOL/HDL Ratio: 2.7 (calc) (ref <5.0)
Triglycerides: 49 mg/dL (ref <150)

## 2017-05-17 LAB — TSH: TSH: 0.68 mIU/L

## 2017-05-17 MED ORDER — VITAMIN D (ERGOCALCIFEROL) 1.25 MG (50000 UNIT) PO CAPS: ORAL_CAPSULE | ORAL | 0 refills | Status: DC

## 2017-05-19 DIAGNOSIS — Z23 Encounter for immunization: Secondary | ICD-10-CM | POA: Diagnosis not present

## 2017-06-04 ENCOUNTER — Telehealth: Payer: Self-pay | Admitting: Obstetrics & Gynecology

## 2017-06-04 ENCOUNTER — Encounter: Payer: Self-pay | Admitting: Obstetrics & Gynecology

## 2017-06-04 ENCOUNTER — Ambulatory Visit (INDEPENDENT_AMBULATORY_CARE_PROVIDER_SITE_OTHER): Payer: BLUE CROSS/BLUE SHIELD | Admitting: Obstetrics & Gynecology

## 2017-06-04 VITALS — BP 102/70 | HR 86 | Ht 70.0 in | Wt 152.0 lb

## 2017-06-04 DIAGNOSIS — Z90710 Acquired absence of both cervix and uterus: Secondary | ICD-10-CM | POA: Diagnosis not present

## 2017-06-04 DIAGNOSIS — Z1231 Encounter for screening mammogram for malignant neoplasm of breast: Secondary | ICD-10-CM

## 2017-06-04 DIAGNOSIS — Z01419 Encounter for gynecological examination (general) (routine) without abnormal findings: Secondary | ICD-10-CM

## 2017-06-04 DIAGNOSIS — Z1239 Encounter for other screening for malignant neoplasm of breast: Secondary | ICD-10-CM

## 2017-06-04 DIAGNOSIS — Z0000 Encounter for general adult medical examination without abnormal findings: Secondary | ICD-10-CM

## 2017-06-04 NOTE — Telephone Encounter (Signed)
Patient returned the call and prefers to be seen at [facility].

## 2017-06-04 NOTE — Patient Instructions (Signed)
PAP every three - five years Mammogram every year    Call [phone] to schedule at [facility] or      [facility] Colonoscopy every 10 years, next at age 45 Labs yearly (with PCP)

## 2017-06-04 NOTE — Telephone Encounter (Signed)
Last year's mammogram was at [facility], need to confirm where the patient would like to be seen. Lmtrc.

## 2017-06-04 NOTE — Telephone Encounter (Signed)
-----   Message from Gae Dry, MD sent at 06/04/2017  8:50 AM EST ----- Regarding: [facility] Jan

## 2017-06-04 NOTE — Progress Notes (Signed)
HPI:      [name] is a 46 y.o. G0P0000 who LMP was Patient's last menstrual period was 05/03/2015., she presents today for her annual examination. The patient has no complaints today. The patient is sexually active. Her last pap: approximate date 2016 and was normal and last mammogram: approximate date 2018 (Jan) and was normal. The patient does perform self breast exams.  There is no notable family history of breast or ovarian cancer in her family.  The patient has regular exercise: yes.  The patient denies current symptoms of depression.    GYN History: Contraception: status post hysterectomy  PMHx: Past Medical History:  Diagnosis Date  . Fibroids   . Heavy menstrual bleeding   . Mitral prolapse    Past Surgical History:  Procedure Laterality Date  . ABDOMINAL HYSTERECTOMY  05/18/2015  . COLONOSCOPY    . LAPAROSCOPIC HYSTERECTOMY N/A 05/18/2015   Procedure: HYSTERECTOMY TOTAL LAPAROSCOPIC/BILATERAL SALPINGECTOMY;  Surgeon: Gae Dry, MD;  Location: [facility] ORS;  Service: Gynecology;  Laterality: N/A;  . WISDOM TOOTH EXTRACTION Bilateral    Family History  Problem Relation Age of Onset  . Glaucoma Mother   . Hypothyroidism Mother   . Healthy Father   . Healthy Sister   . Prostate cancer Paternal Uncle        x 2  . Kidney cancer Neg Hx   . Bladder Cancer Neg Hx    Social History   Tobacco Use  . Smoking status: Never Smoker  . Smokeless tobacco: Never Used  Substance Use Topics  . Alcohol use: No    Alcohol/week: 0.0 oz  . Drug use: No    Current Outpatient Medications:  .  ascorbic acid (VITAMIN C) 500 MG tablet, Take by mouth., Disp: , Rfl:  .  cyanocobalamin 1000 MCG tablet, Take by mouth., Disp: , Rfl:  .  Multiple Vitamins-Minerals (MULTIVITAMIN WOMEN) TABS, Take by mouth., Disp: , Rfl:  .  Probiotic Product (SOLUBLE FIBER/PROBIOTICS) CHEW, Chew by mouth., Disp: , Rfl:  .  Vitamin D, Cholecalciferol, 1000 UNITS TABS, Take by mouth., Disp: ,  Rfl:  .  Vitamin D, Ergocalciferol, (DRISDOL) 50000 units CAPS capsule, Take 1 ( 50,000 units total) capsule once a week for 12 weeks., Disp: 12 capsule, Rfl: 0 Allergies: Patient has no known allergies.  Review of Systems  Constitutional: Negative for chills, fever and malaise/fatigue.  HENT: Negative for congestion, sinus pain and sore throat.   Eyes: Negative for blurred vision and pain.  Respiratory: Negative for cough and wheezing.   Cardiovascular: Negative for chest pain and leg swelling.  Gastrointestinal: Negative for abdominal pain, constipation, diarrhea, heartburn, nausea and vomiting.  Genitourinary: Negative for dysuria, frequency, hematuria and urgency.  Musculoskeletal: Negative for back pain, joint pain, myalgias and neck pain.  Skin: Negative for itching and rash.  Neurological: Negative for dizziness, tremors and weakness.  Endo/Heme/Allergies: Does not bruise/bleed easily.  Psychiatric/Behavioral: Negative for depression. The patient is not nervous/anxious and does not have insomnia.     Objective: BP 102/70   Pulse 86   Ht 5\' 10"  (1.778 m)   Wt 152 lb (68.9 kg)   LMP 05/03/2015   BMI 21.81 kg/m   Filed Weights   06/04/17 0819  Weight: 152 lb (68.9 kg)   Body mass index is 21.81 kg/m. Physical Exam  Constitutional: She is oriented to person, place, and time. She appears well-developed and well-nourished. No distress.  Genitourinary: Rectum normal and vagina normal.  Pelvic exam was performed with patient supine. There is no rash or lesion on the right labia. There is no rash or lesion on the left labia. Vagina exhibits no lesion. No bleeding in the vagina. Right adnexum does not display mass and does not display tenderness. Left adnexum does not display mass and does not display tenderness.  Genitourinary Comments: Absent Uterus Absent cervix Vaginal cuff well healed  HENT:  Head: Normocephalic and atraumatic. Head is without laceration.  Right Ear: Hearing  normal.  Left Ear: Hearing normal.  Nose: No epistaxis.  No foreign bodies.  Mouth/Throat: Uvula is midline, oropharynx is clear and moist and mucous membranes are normal.  Eyes: Pupils are equal, round, and reactive to light.  Neck: Normal range of motion. Neck supple. No thyromegaly present.  Cardiovascular: Normal rate and regular rhythm. Exam reveals no gallop and no friction rub.  No murmur heard. Pulmonary/Chest: Effort normal and breath sounds normal. No respiratory distress. She has no wheezes. Right breast exhibits no mass, no skin change and no tenderness. Left breast exhibits no mass, no skin change and no tenderness.  Abdominal: Soft. Bowel sounds are normal. She exhibits no distension. There is no tenderness. There is no rebound.  Musculoskeletal: Normal range of motion.  Neurological: She is alert and oriented to person, place, and time. No cranial nerve deficit.  Skin: Skin is warm and dry.  Psychiatric: She has a normal mood and affect. Judgment normal.  Vitals reviewed.   Assessment:  ANNUAL EXAM 1. Annual physical exam   2. Screening for breast cancer   3. S/P hysterectomy      Screening Plan:            1.  Cervical Screening-  Pap smear done today  2. Breast screening- Exam annually and mammogram>40 planned   3. Colonoscopy every 10 years, Hemoccult testing - after age 4  4. Labs managed by PCP  5. Counseling for contraception: Hyst    F/U  Return in about 1 year (around 06/04/2018) for Annual.  Barnett Applebaum, MD, [facility], [facility] 06/04/2017  8:49 AM

## 2017-06-05 ENCOUNTER — Other Ambulatory Visit: Payer: Self-pay | Admitting: Obstetrics & Gynecology

## 2017-06-05 DIAGNOSIS — Z1231 Encounter for screening mammogram for malignant neoplasm of breast: Secondary | ICD-10-CM

## 2017-06-05 LAB — PAP IG (IMAGE GUIDED): PAP Smear Comment: 0

## 2017-07-16 ENCOUNTER — Ambulatory Visit
Admission: RE | Admit: 2017-07-16 | Discharge: 2017-07-16 | Disposition: A | Discharge status: Home / Self Care | Payer: BLUE CROSS/BLUE SHIELD | Source: Ambulatory Visit | Attending: Obstetrics & Gynecology | Admitting: Obstetrics & Gynecology

## 2017-07-16 DIAGNOSIS — Z1231 Encounter for screening mammogram for malignant neoplasm of breast: Secondary | ICD-10-CM | POA: Insufficient documentation

## 2017-07-16 DIAGNOSIS — Z1239 Encounter for other screening for malignant neoplasm of breast: Secondary | ICD-10-CM

## 2017-11-05 IMAGING — CT CT ABD-PEL WO/W CM
1 of 4 series · 11 of 32 positions shown, 17 images · IV contrast (iopamidol)
Comparison: 07/17/2016 renal sonogram.

CLINICAL DATA: Microhematuria.  Prior hysterectomy.

EXAM:
CT ABDOMEN AND PELVIS WITHOUT AND WITH CONTRAST
TECHNIQUE: Multidetector CT imaging of the abdomen and pelvis was performed
following the standard protocol before and following the bolus
administration of intravenous contrast.
CONTRAST:  150mL LITOQ2-UWW IOPAMIDOL (LITOQ2-UWW) INJECTION 61%

[Series 8: axial delay · axial · delayed · 0.65mm/px · z∈[-1120,-734]mm · 11 of 93 slices shown, 17 images]
[im 8/93  soft-tissue]
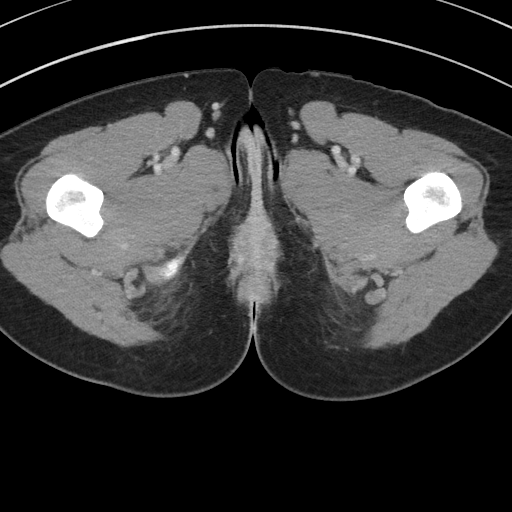
[im 8/93  bone]
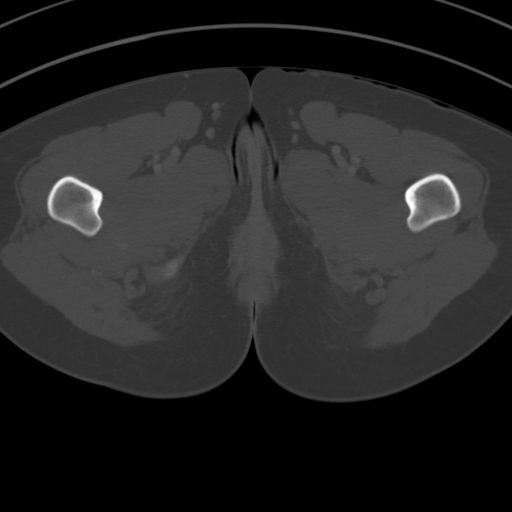
[im 16/93  soft-tissue]
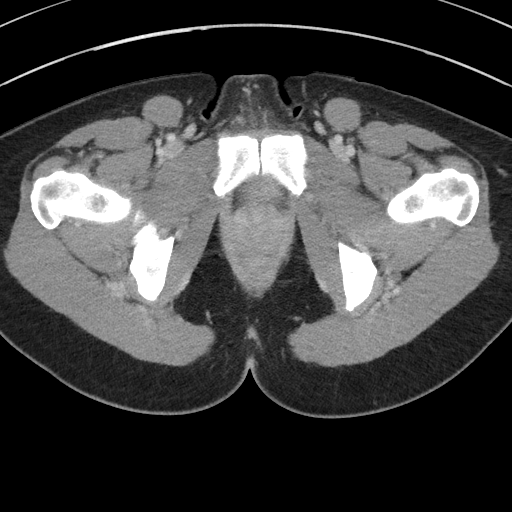
[im 24/93  soft-tissue]
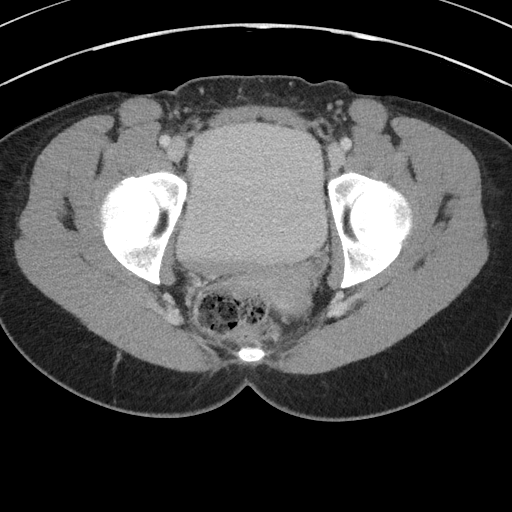
[im 31/93  soft-tissue]
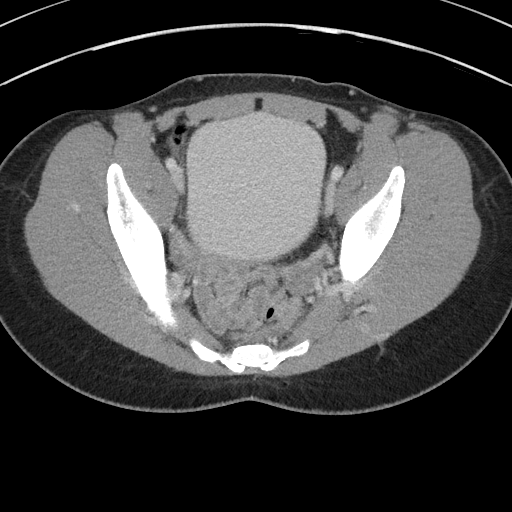
[im 39/93  soft-tissue]
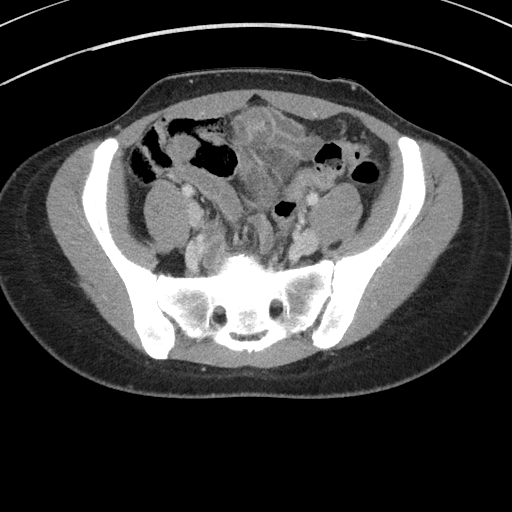
[im 47/93  soft-tissue]
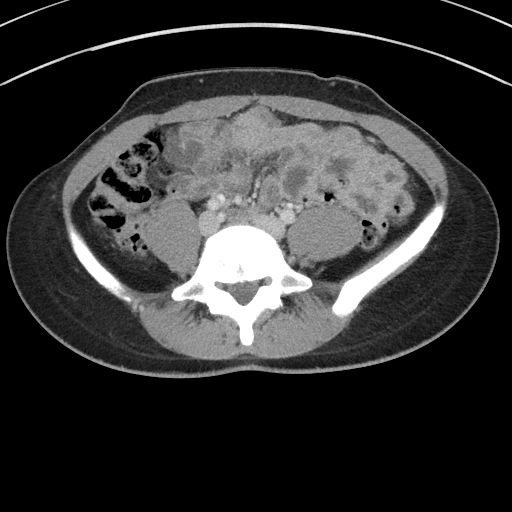
[im 54/93  soft-tissue]
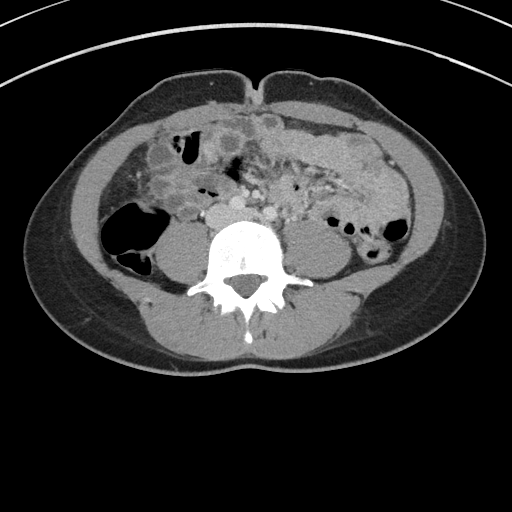
[im 62/93  soft-tissue]
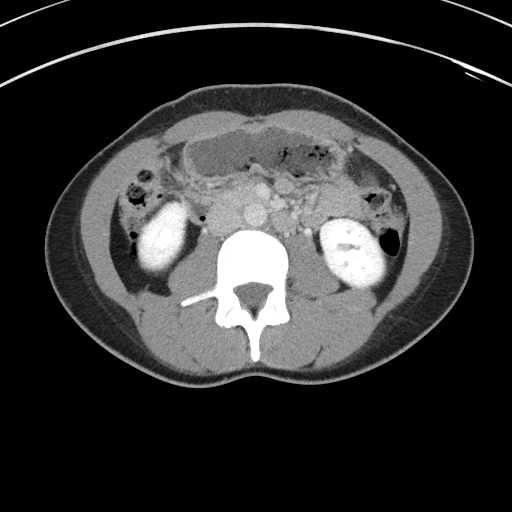
[im 62/93  lung]
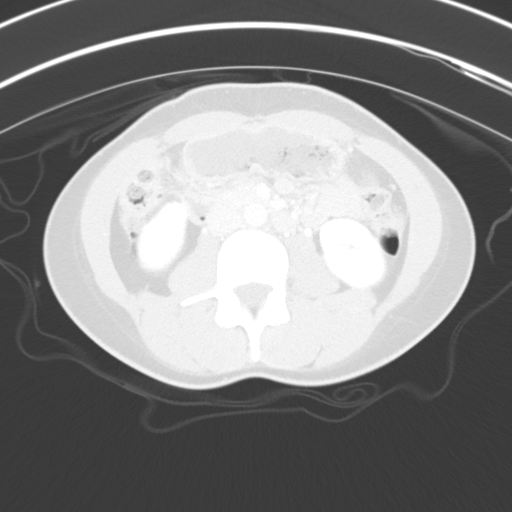
[im 70/93  soft-tissue]
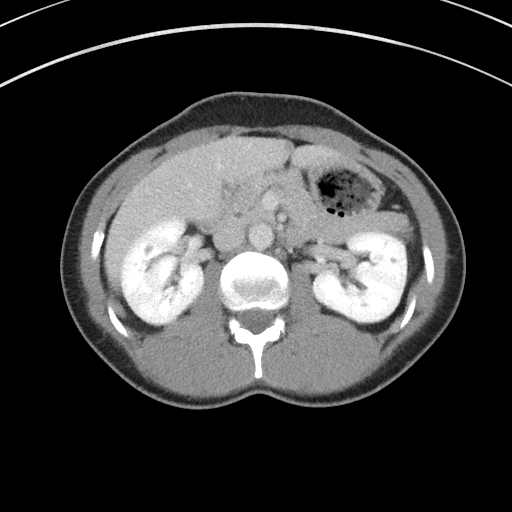
[im 70/93  lung]
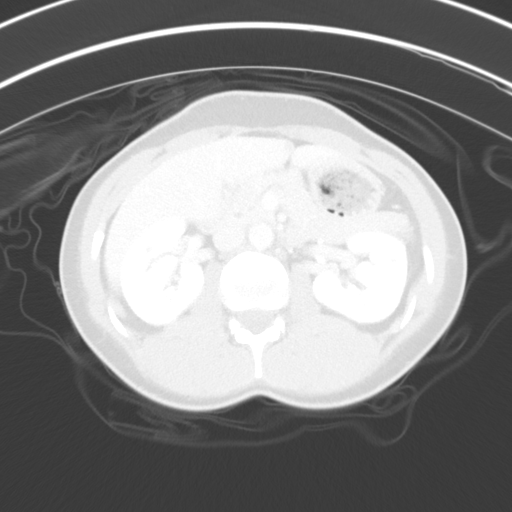
[im 70/93  bone]
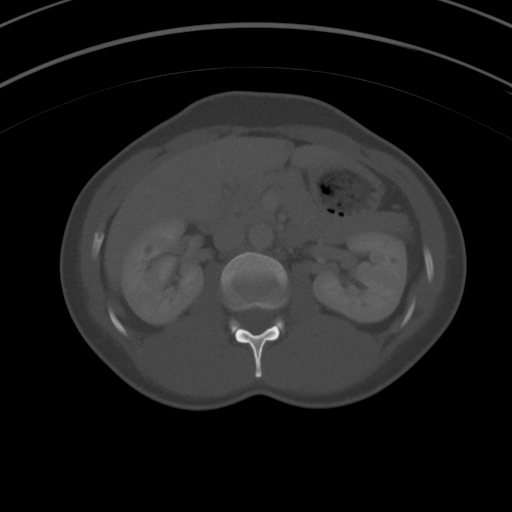
[im 77/93  soft-tissue]
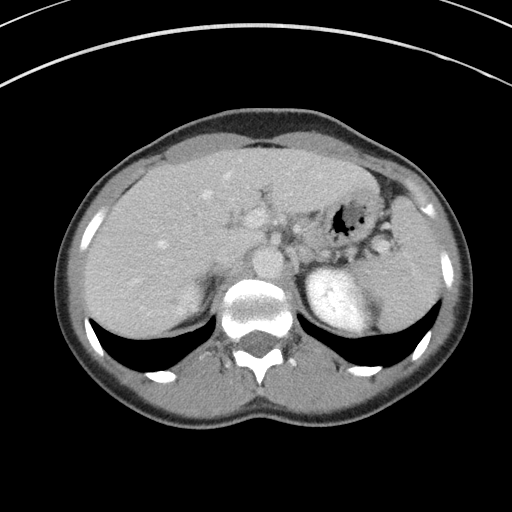
[im 77/93  lung]
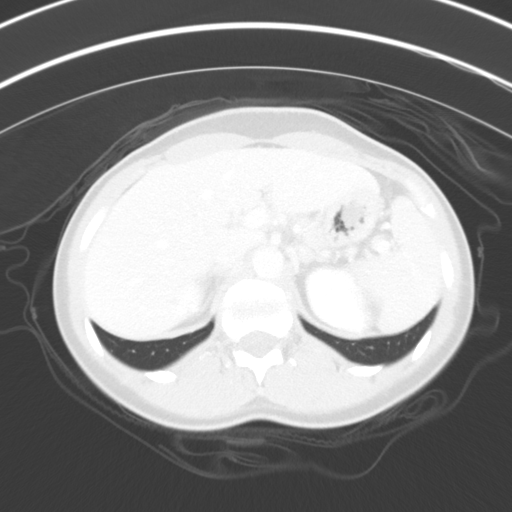
[im 85/93  soft-tissue]
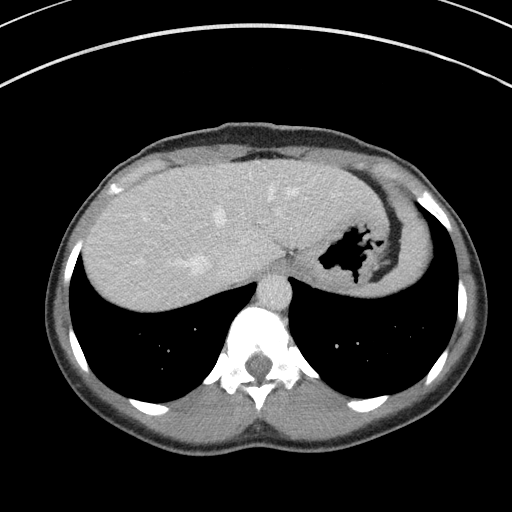
[im 85/93  lung]
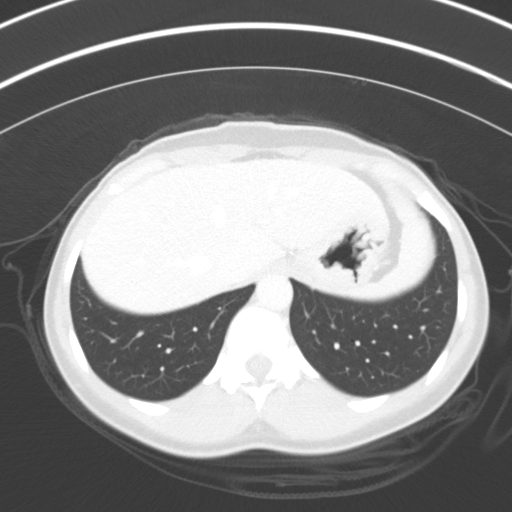

[11 of 32 positions shown; findings below may reference images not displayed]

FINDINGS: Lower chest: No significant pulmonary nodules or acute consolidative
airspace disease.

Hepatobiliary: Normal liver with no liver mass. Normal gallbladder
with no radiopaque cholelithiasis. No biliary ductal dilatation.

Pancreas: Normal, with no mass or duct dilation.

Spleen: Normal size. No mass.

Adrenals/Urinary Tract: Normal adrenals. Nonobstructing 2 mm
interpolar right renal stone. No additional right renal stones. No
left renal stones. No hydronephrosis. Normal caliber ureters, with
no ureteral stones. Hypodense 0.5 cm anterior upper right renal
cortical lesion, too small to characterize, for which no further
follow-up is required. No additional renal cortical masses. On
delayed imaging, there is no urothelial wall thickening and there
are no filling defects in the opacified portions of the bilateral
collecting systems or ureters, noting limited opacification of the
bilateral renal collecting systems and ureters, which limits
evaluation. No bladder stones. No bladder wall thickening. No
bladder mass. No bladder diverticulum.

Stomach/Bowel: Grossly normal stomach. Normal caliber small bowel
with no small bowel wall thickening. Normal appendix . Normal large
bowel with no diverticulosis, large bowel wall thickening or
pericolonic fat stranding.

Vascular/Lymphatic: Normal caliber abdominal aorta. Patent portal,
splenic, hepatic and renal veins. No pathologically enlarged lymph
nodes in the abdomen or pelvis.

Reproductive: Status post hysterectomy, with no abnormal findings at
the vaginal cuff. No adnexal mass.

Other: No pneumoperitoneum, ascites or focal fluid collection.

Musculoskeletal: No aggressive appearing focal osseous lesions.
IMPRESSION: 1. Nonobstructing 2 mm right renal stone. No hydronephrosis. No
ureteral or bladder stones.
2. No suspicious renal cortical masses.  No urothelial lesions.

## 2017-11-21 ENCOUNTER — Ambulatory Visit (INDEPENDENT_AMBULATORY_CARE_PROVIDER_SITE_OTHER): Payer: BLUE CROSS/BLUE SHIELD | Admitting: Family Medicine

## 2017-11-21 ENCOUNTER — Encounter: Payer: Self-pay | Admitting: Family Medicine

## 2017-11-21 VITALS — BP 110/68 | HR 96 | Resp 16 | Ht 70.0 in | Wt 161.3 lb

## 2017-11-21 DIAGNOSIS — R000 Tachycardia, unspecified: Secondary | ICD-10-CM

## 2017-11-21 DIAGNOSIS — R072 Precordial pain: Secondary | ICD-10-CM | POA: Diagnosis not present

## 2017-11-21 DIAGNOSIS — E559 Vitamin D deficiency, unspecified: Secondary | ICD-10-CM | POA: Diagnosis not present

## 2017-11-21 NOTE — Progress Notes (Addendum)
Name: Abigail Massey   MRN: [id number]    DOB: 13-May-1971   Date:11/21/2017       Progress Note  Subjective  Chief Complaint  Chief Complaint  Patient presents with  . vitamin d    wants vitamin d levels checked  . Tachycardia    has concerns about resting heart rate being elevated.    HPI  Precordial chest pain: on left side, going on for the past 6-12 month, frequency is unchanged, it is described as aching like sometimes like gas on left upper side, sometimes on left shoulder and upper back, it can last all day, not associated with sob, diaphoresis or fatigue. No nausea or vomiting. She does not have DM, HTN, dyslipidemia or obesity. She is more stressed at work lately. More work to do since other employees left and the lay offs have not started yet.   Tachycardia: she has noticed that her resting heart rate seems higher than usual, around 90's. Over the past 6 months.   Vitamin D deficiency: she would like to be rechecked, still taking otc medication   Patient Active Problem List   Diagnosis Date Noted  . History of nephrolithiasis 07/22/2016  . S/P hysterectomy 05/18/2015  . Eyelid cyst 04/26/2015    Past Surgical History:  Procedure Laterality Date  . ABDOMINAL HYSTERECTOMY  05/18/2015  . COLONOSCOPY    . LAPAROSCOPIC HYSTERECTOMY N/A 05/18/2015   Procedure: HYSTERECTOMY TOTAL LAPAROSCOPIC/BILATERAL SALPINGECTOMY;  Surgeon: Gae Dry, MD;  Location: [facility] ORS;  Service: Gynecology;  Laterality: N/A;  . WISDOM TOOTH EXTRACTION Bilateral     Family History  Problem Relation Age of Onset  . Glaucoma Mother   . Hypothyroidism Mother   . Healthy Father   . Healthy Sister   . Prostate cancer Paternal Uncle        x 2  . Kidney cancer Neg Hx   . Bladder Cancer Neg Hx     Social History   Socioeconomic History  . Marital status: Single    Spouse name: Not on file  . Number of children: 0  . Years of education: Not on file  . Highest education level:  Master's degree (e.g., MA, MS, MEng, MEd, MSW, MBA)  Occupational History  . Occupation: Chief Financial Officer   Social Needs  . Financial resource strain: Not on file  . Food insecurity:    Worry: Not on file    Inability: Not on file  . Transportation needs:    Medical: Not on file    Non-medical: Not on file  Tobacco Use  . Smoking status: Never Smoker  . Smokeless tobacco: Never Used  Substance and Sexual Activity  . Alcohol use: No    Alcohol/week: 0.0 oz  . Drug use: No  . Sexual activity: Not Currently    Partners: Male  Lifestyle  . Physical activity:    Days per week: 2 days    Minutes per session: 30 min  . Stress: Not at all  Relationships  . Social connections:    Talks on phone: Not on file    Gets together: Not on file    Attends religious service: Not on file    Active member of club or organization: Not on file    Attends meetings of clubs or organizations: Not on file    Relationship status: Not on file  . Intimate partner violence:    Fear of current or ex partner: Not on file    Emotionally abused:  Not on file    Physically abused: Not on file    Forced sexual activity: Not on file  Other Topics Concern  . Not on file  Social History Narrative   Never married, no children     Current Outpatient Medications:  Marland Kitchen  Multiple Vitamins-Minerals (MULTIVITAMIN WOMEN) TABS, Take by mouth., Disp: , Rfl:  .  Vitamin D, Cholecalciferol, 1000 UNITS TABS, Take by mouth., Disp: , Rfl:   No Known Allergies   ROS  Constitutional: Negative for fever or weight change.  Respiratory: Negative for cough and shortness of breath.   Cardiovascular: positive  For left anterior  chest pain but no palpitations, however she feels like resting heart rate is elevated   Gastrointestinal: Negative for abdominal pain, no bowel changes.  Musculoskeletal: Negative for gait problem or joint swelling.  Skin: Negative for rash.   Neurological: Negative for dizziness or headache.  No other  specific complaints in a complete review of systems (except as listed in HPI above).  Objective  Vitals:   11/21/17 1503  BP: 110/68  Pulse: 96  Resp: 16  SpO2: 99%  Weight: 161 lb 4.8 oz (73.2 kg)  Height: 5\' 10"  (1.778 m)    Body mass index is 23.14 kg/m.  Physical Exam  Constitutional: Patient appears well-developed and well-nourished. Obese No distress.  HEENT: head atraumatic, normocephalic, pupils equal and reactive to light,  neck supple, throat within normal limits Cardiovascular: Normal rate, regular rhythm and normal heart sounds.  No murmur heard. No BLE edema. Pulmonary/Chest: Effort normal and breath sounds normal. No respiratory distress. Abdominal: Soft.  There is no tenderness. Psychiatric: Patient has a normal mood and affect. behavior is normal. Judgment and thought content normal.  PHQ2/9: Depression screen [facility] 2/9 05/16/2017 05/03/2016 04/26/2015  Decreased Interest 0 0 0  Down, Depressed, Hopeless 0 0 0  PHQ - 2 Score 0 0 0     Fall Risk: Fall Risk  11/21/2017 11/21/2017 05/16/2017 05/03/2016 04/26/2015  Falls in the past year? No No No No No     Functional Status Survey: Is the patient deaf or have difficulty hearing?: No Does the patient have difficulty seeing, even when wearing glasses/contacts?: No Does the patient have difficulty concentrating, remembering, or making decisions?: No Does the patient have difficulty walking or climbing stairs?: No Does the patient have difficulty dressing or bathing?: No Does the patient have difficulty doing errands alone such as visiting a doctor's office or shopping?: No    Assessment & Plan  1. Vitamin D deficiency  - VITAMIN D 25 Hydroxy (Vit-D Deficiency, Fractures)  2. Precordial pain  - EKG: sinus rhythm , possible atrial enlargement, discussed with patient, we will hold off on referral at this time, she will keep a log of her symptoms (frequency/triggers) and return sooner if needed - Hs  CRP Consider tums prn , write down what happened the day before   3. Tachycardia  - Thyroid Panel With TSH

## 2017-11-22 ENCOUNTER — Encounter: Payer: Self-pay | Admitting: Family Medicine

## 2017-11-22 LAB — THYROID PANEL WITH TSH
Free Thyroxine Index: 2.4 (ref 1.4–3.8)
T3 Uptake: 31 % (ref 22–35)
T4, Total: 7.9 ug/dL (ref 5.1–11.9)
TSH: 0.55 mIU/L

## 2017-11-22 LAB — VITAMIN D 25 HYDROXY (VIT D DEFICIENCY, FRACTURES): Vit D, 25-Hydroxy: 31 ng/mL (ref 30–100)

## 2017-11-22 LAB — HIGH SENSITIVITY CRP: hs-CRP: 0.7 mg/L

## 2018-06-01 DIAGNOSIS — Z23 Encounter for immunization: Secondary | ICD-10-CM | POA: Diagnosis not present

## 2018-06-18 ENCOUNTER — Encounter: Payer: Self-pay | Admitting: Obstetrics & Gynecology

## 2018-06-18 ENCOUNTER — Ambulatory Visit (INDEPENDENT_AMBULATORY_CARE_PROVIDER_SITE_OTHER): Payer: BLUE CROSS/BLUE SHIELD | Admitting: Obstetrics & Gynecology

## 2018-06-18 VITALS — BP 100/60 | Ht 70.0 in | Wt 161.0 lb

## 2018-06-18 DIAGNOSIS — Z01419 Encounter for gynecological examination (general) (routine) without abnormal findings: Secondary | ICD-10-CM | POA: Diagnosis not present

## 2018-06-18 DIAGNOSIS — Z90710 Acquired absence of both cervix and uterus: Secondary | ICD-10-CM

## 2018-06-18 DIAGNOSIS — Z1239 Encounter for other screening for malignant neoplasm of breast: Secondary | ICD-10-CM

## 2018-06-18 NOTE — Progress Notes (Signed)
HPI:      [name] is a 47 y.o. G0P0000 who LMP was in the past (s/p TLH), she presents today for her annual examination.  The patient has no complaints today. The patient is not currently sexually active. Herlast pap: approximate date 2018 and was normal and last mammogram: approximate date 2019 (Jan) and was normal.  The patient does perform self breast exams.  There is no notable family history of breast or ovarian cancer in her family. The patient is not taking hormone replacement therapy. Patient denies post-menopausal vaginal bleeding.   The patient has regular exercise: yes. The patient denies current symptoms of depression.  She is an Chief Financial Officer for [employer], min stress.  Min hot flash sx's.  GYN Hx: Last Colonoscopy:5 years ago. Normal.  Last DEXA: never ago.    PMHx: Past Medical History:  Diagnosis Date  . Fibroids   . Heavy menstrual bleeding   . Mitral prolapse    Past Surgical History:  Procedure Laterality Date  . ABDOMINAL HYSTERECTOMY  05/18/2015  . COLONOSCOPY    . LAPAROSCOPIC HYSTERECTOMY N/A 05/18/2015   Procedure: HYSTERECTOMY TOTAL LAPAROSCOPIC/BILATERAL SALPINGECTOMY;  Surgeon: Gae Dry, MD;  Location: [facility] ORS;  Service: Gynecology;  Laterality: N/A;  . WISDOM TOOTH EXTRACTION Bilateral    Family History  Problem Relation Age of Onset  . Glaucoma Mother   . Hypothyroidism Mother   . Healthy Father   . Healthy Sister   . Prostate cancer Paternal Uncle        x 2  . Kidney cancer Neg Hx   . Bladder Cancer Neg Hx    Social History   Tobacco Use  . Smoking status: Never Smoker  . Smokeless tobacco: Never Used  Substance Use Topics  . Alcohol use: No    Alcohol/week: 0.0 standard drinks  . Drug use: No    Current Outpatient Medications:  Marland Kitchen  Multiple Vitamins-Minerals (MULTIVITAMIN WOMEN) TABS, Take by mouth., Disp: , Rfl:  .  Vitamin D, Cholecalciferol, 1000 UNITS TABS, Take by mouth., Disp: , Rfl:  Allergies: Patient has no known  allergies.  Review of Systems  Constitutional: Negative for chills, fever and malaise/fatigue.  HENT: Negative for congestion, sinus pain and sore throat.   Eyes: Negative for blurred vision and pain.  Respiratory: Negative for cough and wheezing.   Cardiovascular: Negative for chest pain and leg swelling.  Gastrointestinal: Negative for abdominal pain, constipation, diarrhea, heartburn, nausea and vomiting.  Genitourinary: Negative for dysuria, frequency, hematuria and urgency.  Musculoskeletal: Negative for back pain, joint pain, myalgias and neck pain.  Skin: Negative for itching and rash.  Neurological: Negative for dizziness, tremors and weakness.  Endo/Heme/Allergies: Does not bruise/bleed easily.  Psychiatric/Behavioral: Negative for depression. The patient is not nervous/anxious and does not have insomnia.    Objective: BP 100/60   Ht 5\' 10"  (1.778 m)   Wt 161 lb (73 kg)   LMP 05/03/2015   BMI 23.10 kg/m   Filed Weights   06/18/18 0910  Weight: 161 lb (73 kg)   Body mass index is 23.1 kg/m. Physical Exam Constitutional:      General: She is not in acute distress.    Appearance: She is well-developed.  Genitourinary:     Pelvic exam was performed with patient supine.     Vagina and rectum normal.     No lesions in the vagina.     No vaginal bleeding.     No right or left  adnexal mass present.     Right adnexa not tender.     Left adnexa not tender.     Genitourinary Comments: Absent Uterus Absent cervix Vaginal cuff well healed  HENT:     Head: Normocephalic and atraumatic. No laceration.     Right Ear: Hearing normal.     Left Ear: Hearing normal.     Mouth/Throat:     Pharynx: Uvula midline.  Eyes:     Pupils: Pupils are equal, round, and reactive to light.  Neck:     Musculoskeletal: Normal range of motion and neck supple.     Thyroid: No thyromegaly.  Cardiovascular:     Rate and Rhythm: Normal rate and regular rhythm.     Heart sounds: No murmur. No  friction rub. No gallop.   Pulmonary:     Effort: Pulmonary effort is normal. No respiratory distress.     Breath sounds: Normal breath sounds. No wheezing.  Chest:     Breasts:        Right: No mass, skin change or tenderness.        Left: No mass, skin change or tenderness.  Abdominal:     General: Bowel sounds are normal. There is no distension.     Palpations: Abdomen is soft.     Tenderness: There is no abdominal tenderness. There is no rebound.  Musculoskeletal: Normal range of motion.  Neurological:     Mental Status: She is alert and oriented to person, place, and time.     Cranial Nerves: No cranial nerve deficit.  Skin:    General: Skin is warm and dry.  Psychiatric:        Judgment: Judgment normal.  Vitals signs reviewed.   Assessment: Annual Exam 1. Screening for breast cancer   2. Women's annual routine gynecological examination   3. S/P hysterectomy    Plan:            1.  Cervical Screening-  Pap smear schedule reviewed with patient, due 2021  2. Breast screening- Exam annually and mammogram scheduled  3. Colonoscopy every 10 years and due 2023, Hemoccult testing after age 52  4. Labs managed by PCP  5. Counseling for hormonal therapy: none     F/U  Return in about 1 year (around 06/19/2019) for Annual.  Barnett Applebaum, MD, [facility], [facility] 06/18/2018  9:29 AM

## 2018-06-18 NOTE — Patient Instructions (Addendum)
PAP every 3-5 years (Normal 2018) Mammogram every year, due in Jan 2020    Call [phone] to schedule at [facility] Colonoscopy every 10 years, due 2023 Labs yearly (with PCP)

## 2018-07-17 ENCOUNTER — Ambulatory Visit
Admission: RE | Admit: 2018-07-17 | Discharge: 2018-07-17 | Disposition: A | Discharge status: Home / Self Care | Payer: BLUE CROSS/BLUE SHIELD | Source: Ambulatory Visit | Attending: Obstetrics & Gynecology | Admitting: Obstetrics & Gynecology

## 2018-07-17 DIAGNOSIS — Z1239 Encounter for other screening for malignant neoplasm of breast: Secondary | ICD-10-CM

## 2018-07-17 DIAGNOSIS — Z1231 Encounter for screening mammogram for malignant neoplasm of breast: Secondary | ICD-10-CM | POA: Diagnosis not present

## 2019-04-02 DIAGNOSIS — Z23 Encounter for immunization: Secondary | ICD-10-CM | POA: Diagnosis not present

## 2019-06-11 ENCOUNTER — Telehealth: Payer: Self-pay

## 2019-06-11 NOTE — Telephone Encounter (Signed)
Please get appt scheduled for pt's annual with [facility]. She is requesting a mammorgram, but she needs an annual and breast exam first. Thank you. She is aware you will call her

## 2019-06-11 NOTE — Telephone Encounter (Signed)
Patient is schedule for 06/26/19 with [facility]

## 2019-06-26 ENCOUNTER — Other Ambulatory Visit: Payer: Self-pay

## 2019-06-26 ENCOUNTER — Encounter: Payer: Self-pay | Admitting: Obstetrics & Gynecology

## 2019-06-26 ENCOUNTER — Ambulatory Visit (INDEPENDENT_AMBULATORY_CARE_PROVIDER_SITE_OTHER): Payer: BC Managed Care – PPO | Admitting: Obstetrics & Gynecology

## 2019-06-26 VITALS — BP 120/80 | Ht 70.0 in | Wt 160.0 lb

## 2019-06-26 DIAGNOSIS — Z01419 Encounter for gynecological examination (general) (routine) without abnormal findings: Secondary | ICD-10-CM

## 2019-06-26 DIAGNOSIS — Z1231 Encounter for screening mammogram for malignant neoplasm of breast: Secondary | ICD-10-CM

## 2019-06-26 NOTE — Progress Notes (Signed)
HPI:      [name] is a 48 y.o. G0P0000 who LMP was in the past, she presents today for her annual examination.  The patient has no complaints today. She has a ingrown hair or lesion on labia. The patient is not currently sexually active. Herlast pap: approximate date 2018 and was normal and last mammogram: approximate date 2019 and was normal.  The patient does perform self breast exams.  There is no notable family history of breast or ovarian cancer in her family. The patient is not taking hormone replacement therapy. Patient denies post-menopausal vaginal bleeding.   The patient has regular exercise: yes. The patient denies current symptoms of depression.    GYN Hx: Last Colonoscopy:6 years ago. Normal.  Last DEXA: never ago.    PMHx: Past Medical History:  Diagnosis Date  . Fibroids   . Heavy menstrual bleeding   . Mitral prolapse    Past Surgical History:  Procedure Laterality Date  . ABDOMINAL HYSTERECTOMY  05/18/2015  . COLONOSCOPY    . LAPAROSCOPIC HYSTERECTOMY N/A 05/18/2015   Procedure: HYSTERECTOMY TOTAL LAPAROSCOPIC/BILATERAL SALPINGECTOMY;  Surgeon: Gae Dry, MD;  Location: [facility] ORS;  Service: Gynecology;  Laterality: N/A;  . WISDOM TOOTH EXTRACTION Bilateral    Family History  Problem Relation Age of Onset  . Glaucoma Mother   . Hypothyroidism Mother   . Healthy Father   . Healthy Sister   . Prostate cancer Paternal Uncle        x 2  . Kidney cancer Neg Hx   . Bladder Cancer Neg Hx    Social History   Tobacco Use  . Smoking status: Never Smoker  . Smokeless tobacco: Never Used  Substance Use Topics  . Alcohol use: No    Alcohol/week: 0.0 standard drinks  . Drug use: No    Current Outpatient Medications:  Marland Kitchen  Multiple Vitamins-Minerals (MULTIVITAMIN WOMEN) TABS, Take by mouth., Disp: , Rfl:  .  Vitamin D, Cholecalciferol, 1000 UNITS TABS, Take by mouth., Disp: , Rfl:  Allergies: Patient has no known allergies.  Review of Systems    Constitutional: Negative for chills, fever and malaise/fatigue.  HENT: Negative for congestion, sinus pain and sore throat.   Eyes: Negative for blurred vision and pain.  Respiratory: Negative for cough and wheezing.   Cardiovascular: Negative for chest pain and leg swelling.  Gastrointestinal: Negative for abdominal pain, constipation, diarrhea, heartburn, nausea and vomiting.  Genitourinary: Negative for dysuria, frequency, hematuria and urgency.  Musculoskeletal: Negative for back pain, joint pain, myalgias and neck pain.  Skin: Negative for itching and rash.  Neurological: Negative for dizziness, tremors and weakness.  Endo/Heme/Allergies: Does not bruise/bleed easily.  Psychiatric/Behavioral: Negative for depression. The patient is not nervous/anxious and does not have insomnia.     Objective: BP 120/80   Ht 5\' 10"  (1.778 m)   Wt 160 lb (72.6 kg)   LMP 05/03/2015   BMI 22.96 kg/m   Filed Weights   06/26/19 0953  Weight: 160 lb (72.6 kg)   Body mass index is 22.96 kg/m. Physical Exam Constitutional:      General: She is not in acute distress.    Appearance: She is well-developed.  Genitourinary:     Pelvic exam was performed with patient supine.     Vagina and rectum normal.     No lesions in the vagina.     No vaginal bleeding.     No right or left adnexal mass present.  Right adnexa not tender.     Left adnexa not tender.     Genitourinary Comments: Absent Uterus Absent cervix Vaginal cuff well healed No external lesion, probable small ingrown hair for folliculitis  HENT:     Head: Normocephalic and atraumatic. No laceration.     Right Ear: Hearing normal.     Left Ear: Hearing normal.     Mouth/Throat:     Pharynx: Uvula midline.  Eyes:     Pupils: Pupils are equal, round, and reactive to light.  Neck:     Thyroid: No thyromegaly.  Cardiovascular:     Rate and Rhythm: Normal rate and regular rhythm.     Heart sounds: No murmur. No friction rub. No  gallop.   Pulmonary:     Effort: Pulmonary effort is normal. No respiratory distress.     Breath sounds: Normal breath sounds. No wheezing.  Chest:     Breasts:        Right: No mass, skin change or tenderness.        Left: No mass, skin change or tenderness.  Abdominal:     General: Bowel sounds are normal. There is no distension.     Palpations: Abdomen is soft.     Tenderness: There is no abdominal tenderness. There is no rebound.  Musculoskeletal:        General: Normal range of motion.     Cervical back: Normal range of motion and neck supple.  Neurological:     Mental Status: She is alert and oriented to person, place, and time.     Cranial Nerves: No cranial nerve deficit.  Skin:    General: Skin is warm and dry.  Psychiatric:        Judgment: Judgment normal.  Vitals reviewed.     Assessment: Annual Exam 1. Women's annual routine gynecological examination   2. Encounter for screening mammogram for malignant neoplasm of breast     Plan:            1.  Cervical Screening-  Pap smear schedule reviewed with patient, every 5 years  2. Breast screening- Exam annually and mammogram scheduled  3. Colonoscopy every 10 years, Hemoccult testing after age 56  4. Labs managed by PCP  5. Counseling for hormonal therapy: no change in therapy today              6. FRAX - FRAX score for assessing the 10 year probability for fracture calculated and discussed today.  Based on age and score today, DEXA is not scheduled.    F/U  Return in about 1 year (around 06/25/2020) for Annual.  Barnett Applebaum, MD, [facility], [facility] 06/26/2019  10:33 AM

## 2019-06-26 NOTE — Patient Instructions (Signed)
PAP every 5 years    Due 2023 Mammogram every year    Call [phone] to schedule at [facility] Colonoscopy every 10 years    Due 2023 Labs yearly (with PCP)

## 2019-07-21 ENCOUNTER — Ambulatory Visit
Admission: RE | Admit: 2019-07-21 | Discharge: 2019-07-21 | Disposition: A | Discharge status: Home / Self Care | Payer: BLUE CROSS/BLUE SHIELD | Source: Ambulatory Visit | Attending: Obstetrics & Gynecology | Admitting: Obstetrics & Gynecology

## 2019-07-21 DIAGNOSIS — Z1231 Encounter for screening mammogram for malignant neoplasm of breast: Secondary | ICD-10-CM | POA: Diagnosis not present

## 2019-08-05 ENCOUNTER — Encounter: Payer: Self-pay | Admitting: Family Medicine

## 2019-08-05 ENCOUNTER — Other Ambulatory Visit: Payer: Self-pay

## 2019-08-05 ENCOUNTER — Ambulatory Visit (INDEPENDENT_AMBULATORY_CARE_PROVIDER_SITE_OTHER): Payer: BC Managed Care – PPO | Admitting: Family Medicine

## 2019-08-05 VITALS — BP 106/70 | HR 102 | Temp 97.5°F | Resp 16 | Ht 69.0 in | Wt 155.7 lb

## 2019-08-05 DIAGNOSIS — Z1159 Encounter for screening for other viral diseases: Secondary | ICD-10-CM | POA: Diagnosis not present

## 2019-08-05 DIAGNOSIS — Z113 Encounter for screening for infections with a predominantly sexual mode of transmission: Secondary | ICD-10-CM | POA: Diagnosis not present

## 2019-08-05 DIAGNOSIS — Z13220 Encounter for screening for lipoid disorders: Secondary | ICD-10-CM

## 2019-08-05 DIAGNOSIS — Z0000 Encounter for general adult medical examination without abnormal findings: Secondary | ICD-10-CM

## 2019-08-05 DIAGNOSIS — E559 Vitamin D deficiency, unspecified: Secondary | ICD-10-CM | POA: Diagnosis not present

## 2019-08-05 DIAGNOSIS — R000 Tachycardia, unspecified: Secondary | ICD-10-CM

## 2019-08-05 DIAGNOSIS — Z131 Encounter for screening for diabetes mellitus: Secondary | ICD-10-CM

## 2019-08-05 DIAGNOSIS — Z862 Personal history of diseases of the blood and blood-forming organs and certain disorders involving the immune mechanism: Secondary | ICD-10-CM

## 2019-08-05 NOTE — Patient Instructions (Signed)

## 2019-08-05 NOTE — Progress Notes (Signed)
Name: Abigail Massey   MRN: [id number]    DOB: 18-Aug-1970   Date:08/05/2019       Progress Note  Subjective  Chief Complaint  Chief Complaint  Patient presents with  . Annual Exam    HPI  Patient presents for annual CPE.  Diet: balanced  Exercise: advised to increase to 150 minutes per week   USPSTF grade A and B recommendations    [facility] Visit from 08/05/2019 in [facility]  AUDIT-C Score  0     Depression: Phq 9 is  negative Depression screen [facility] 2/9 08/05/2019 05/16/2017 05/03/2016 04/26/2015  Decreased Interest 0 0 0 0  Down, Depressed, Hopeless 0 0 0 0  PHQ - 2 Score 0 0 0 0  Altered sleeping 0 - - -  Tired, decreased energy 0 - - -  Change in appetite 0 - - -  Feeling bad or failure about yourself  0 - - -  Trouble concentrating 0 - - -  Moving slowly or fidgety/restless 0 - - -  Suicidal thoughts 0 - - -  PHQ-9 Score 0 - - -   Hypertension: BP Readings from Last 3 Encounters:  08/05/19 106/70  06/26/19 120/80  06/18/18 100/60   Obesity: Wt Readings from Last 3 Encounters:  08/05/19 155 lb 11.2 oz (70.6 kg)  06/26/19 160 lb (72.6 kg)  06/18/18 161 lb (73 kg)   BMI Readings from Last 3 Encounters:  08/05/19 22.99 kg/m  06/26/19 22.96 kg/m  06/18/18 23.10 kg/m     Hep C Screening: today  STD testing and prevention (HIV/chl/gon/syphilis): not interested  Intimate partner violence: no problems  Sexual History (Partners/Practices/Protection from [employer] hx STI/Pregnancy Plans): not currently sexually active, it has been a while  Pain during Intercourse: no history of pain during intercourse in the past  Menstrual History/LMP/Abnormal Bleeding: s/p hysterectomy due to fibroid tumors  Incontinence Symptoms: no problems   Breast cancer:  - Last Mammogram: 07/15/2019  - BRCA gene screening: N/A  Osteoporosis: Discussed high calcium and vitamin D supplementation, weight bearing exercises  Cervical cancer screening: not indicated,  s/p hysterectomy, sees Dr. Kenton Kingfisher   Skin cancer: Discussed monitoring for atypical lesions  Colorectal cancer: she states she had one in [place] around 2014 and it was normal, she will bring me a copy Lung cancer:   Low Dose CT Chest recommended if Age 81-80 years, 30 pack-year currently smoking OR have quit w/in 15years. Patient does not qualify.   ECG: 10/2017   Advanced Care Planning: A voluntary discussion about advance care planning including the explanation and discussion of advance directives.  Discussed health care proxy and Living will, and the patient was able to identify a health care proxy as mother .  Patient does not have a living will at present time.   Lipids: Lab Results  Component Value Date   CHOL 143 05/16/2017   CHOL 130 05/09/2016   CHOL 138 04/29/2015   Lab Results  Component Value Date   HDL 53 05/16/2017   HDL 47 (L) 05/09/2016   HDL 47 04/29/2015   Lab Results  Component Value Date   LDLCALC 76 05/16/2017   LDLCALC 71 05/09/2016   LDLCALC 81 04/29/2015   Lab Results  Component Value Date   TRIG 49 05/16/2017   TRIG 62 05/09/2016   TRIG 52 04/29/2015   Lab Results  Component Value Date   CHOLHDL 2.7 05/16/2017   CHOLHDL 2.8 05/09/2016   CHOLHDL 2.9  04/29/2015   No results found for: LDLDIRECT  Glucose: Glucose  Date Value Ref Range Status  04/29/2015 77 65 - 99 mg/dL Final   Glucose, Bld  Date Value Ref Range Status  05/16/2017 76 65 - 99 mg/dL Final    Comment:    .            Fasting reference interval .   05/09/2016 81 65 - 99 mg/dL Final    Patient Active Problem List   Diagnosis Date Noted  . History of nephrolithiasis 07/22/2016  . S/P hysterectomy 05/18/2015  . Eyelid cyst 04/26/2015    Past Surgical History:  Procedure Laterality Date  . ABDOMINAL HYSTERECTOMY  05/18/2015  . COLONOSCOPY    . LAPAROSCOPIC HYSTERECTOMY N/A 05/18/2015   Procedure: HYSTERECTOMY TOTAL LAPAROSCOPIC/BILATERAL SALPINGECTOMY;  Surgeon: Gae Dry, MD;  Location: [facility] ORS;  Service: Gynecology;  Laterality: N/A;  . WISDOM TOOTH EXTRACTION Bilateral     Family History  Problem Relation Age of Onset  . Glaucoma Mother   . Hypothyroidism Mother   . Healthy Father   . Healthy Sister   . Prostate cancer Paternal Uncle        x 2  . Kidney cancer Neg Hx   . Bladder Cancer Neg Hx   . Breast cancer Neg Hx     Social History   Socioeconomic History  . Marital status: Single    Spouse name: Not on file  . Number of children: 0  . Years of education: Not on file  . Highest education level: Master's degree (e.g., MA, MS, MEng, MEd, MSW, MBA)  Occupational History  . Occupation: Chief Financial Officer   Tobacco Use  . Smoking status: Never Smoker  . Smokeless tobacco: Never Used  Substance and Sexual Activity  . Alcohol use: No    Alcohol/week: 0.0 standard drinks  . Drug use: No  . Sexual activity: Not Currently    Partners: Male  Other Topics Concern  . Not on file  Social History Narrative   Never married, no children   Social Determinants of Health   Financial Resource Strain: Low Risk   . Difficulty of Paying Living Expenses: Not hard at all  Food Insecurity: No Food Insecurity  . Worried About Charity fundraiser in the Last Year: Never true  . Ran Out of Food in the Last Year: Never true  Transportation Needs: No Transportation Needs  . Lack of Transportation (Medical): No  . Lack of Transportation (Non-Medical): No  Physical Activity: Insufficiently Active  . Days of Exercise per Week: 4 days  . Minutes of Exercise per Session: 30 min  Stress: No Stress Concern Present  . Feeling of Stress : Not at all  Social Connections: Moderately Isolated  . Frequency of Communication with Friends and Family: More than three times a week  . Frequency of Social Gatherings with Friends and Family: More than three times a week  . Attends Religious Services: Never  . Active Member of Clubs or Organizations: No  . Attends English as a second language teacher Meetings: Never  . Marital Status: Never married  Intimate Partner Violence: Not At Risk  . Fear of Current or Ex-Partner: No  . Emotionally Abused: No  . Physically Abused: No  . Sexually Abused: No     Current Outpatient Medications:  Marland Kitchen  Multiple Vitamins-Minerals (MULTIVITAMIN WOMEN) TABS, Take by mouth., Disp: , Rfl:  .  Turmeric (QC TUMERIC COMPLEX PO), Take by mouth., Disp: , Rfl:  .  Vitamin D, Cholecalciferol, 1000 UNITS TABS, Take by mouth., Disp: , Rfl:   No Known Allergies   ROS  Constitutional: Negative for fever or significant  weight change.  Respiratory: Negative for cough and shortness of breath.   Cardiovascular: Negative for chest pain or palpitations.  Gastrointestinal: Negative for abdominal pain, no bowel changes.  Musculoskeletal: Negative for gait problem or joint swelling.  Skin: Negative for rash.  Neurological: Negative for dizziness or headache.  No other specific complaints in a complete review of systems (except as listed in HPI above).  Objective  Vitals:   08/05/19 0904  BP: 106/70  Pulse: (!) 102  Resp: 16  Temp: (!) 97.5 F (36.4 C)  TempSrc: Temporal  SpO2: 98%  Weight: 155 lb 11.2 oz (70.6 kg)  Height: '5\' 9"'$  (1.753 m)    Body mass index is 22.99 kg/m.  Physical Exam  Constitutional: Patient appears well-developed and well-nourished. No distress.  HENT: Head: Normocephalic and atraumatic. Ears: B TMs ok, no erythema or effusion; nose and oral mucosa not done today . No scleral icterus.  Neck: Normal range of motion. Neck supple. No JVD present. No thyromegaly present.  Cardiovascular: Normal rate, regular rhythm and normal heart sounds.  No murmur heard. No BLE edema. Pulmonary/Chest: Effort normal and breath sounds normal. No respiratory distress. Abdominal: Soft. Bowel sounds are normal, no distension. There is no tenderness. no masses Breast: no lumps or masses, no nipple discharge or rashes FEMALE GENITALIA:   Not done RECTAL:not done Musculoskeletal: Normal range of motion, no joint effusions. No gross deformities Neurological: he is alert and oriented to person, place, and time. No cranial nerve deficit. Coordination, balance, strength, speech and gait are normal.  Skin: Skin is warm and dry. No rash noted. No erythema.  Psychiatric: Patient has a normal mood and affect. behavior is normal. Judgment and thought content normal.  Fall Risk: Fall Risk  08/05/2019 11/21/2017 11/21/2017 05/16/2017 05/03/2016  Falls in the past year? 0 No No No No  Number falls in past yr: 0 - - - -  Injury with Fall? 0 - - - -    Functional Status Survey: Is the patient deaf or have difficulty hearing?: No Does the patient have difficulty seeing, even when wearing glasses/contacts?: No Does the patient have difficulty concentrating, remembering, or making decisions?: No Does the patient have difficulty walking or climbing stairs?: No Does the patient have difficulty dressing or bathing?: No Does the patient have difficulty doing errands alone such as visiting a doctor's office or shopping?: No   Assessment & Plan  1. Well adult exam  - COMPLETE METABOLIC PANEL WITH GFR - CBC with Differential/Platelet - Hemoglobin A1c - Hepatitis C antibody - HIV Antibody (routine testing w rflx) - RPR - VITAMIN D 25 Hydroxy (Vit-D Deficiency, Fractures) - Lipid panel  2. Vitamin D deficiency  - VITAMIN D 25 Hydroxy (Vit-D Deficiency, Fractures)  3. Need for hepatitis C screening test  - Hepatitis C antibody  4. Routine screening for STI (sexually transmitted infection)  - HIV Antibody (routine testing w rflx) - RPR  5. History of anemia  - CBC with Differential/Platelet  6. Lipid screening  - Lipid panel  7. Diabetes mellitus screening  - Hemoglobin A1c  8. Tachycardia  - TSH  -USPSTF grade A and B recommendations reviewed with patient; age-appropriate recommendations, preventive care, screening  tests, etc discussed and encouraged; healthy living encouraged; see AVS for patient education given to patient -Discussed importance of 150  minutes of physical activity weekly, eat two servings of fish weekly, eat one serving of tree nuts ( cashews, pistachios, pecans, almonds.Marland Kitchen) every other day, eat 6 servings of fruit/vegetables daily and drink plenty of water and avoid sweet beverages.

## 2019-08-06 LAB — CBC WITH DIFFERENTIAL/PLATELET
Absolute Monocytes: 426 cells/uL (ref 200–950)
Basophils Absolute: 11 cells/uL (ref 0–200)
Basophils Relative: 0.2 %
Eosinophils Absolute: 78 cells/uL (ref 15–500)
Eosinophils Relative: 1.4 %
HCT: 44.5 % (ref 35.0–45.0)
Hemoglobin: 14.6 g/dL (ref 11.7–15.5)
Lymphs Abs: 1120 cells/uL (ref 850–3900)
MCH: 27.8 pg (ref 27.0–33.0)
MCHC: 32.8 g/dL (ref 32.0–36.0)
MCV: 84.6 fL (ref 80.0–100.0)
MPV: 11.3 fL (ref 7.5–12.5)
Monocytes Relative: 7.6 %
Neutro Abs: 3965 cells/uL (ref 1500–7800)
Neutrophils Relative %: 70.8 %
Platelets: 202 10*3/uL (ref 140–400)
RBC: 5.26 10*6/uL — ABNORMAL HIGH (ref 3.80–5.10)
RDW: 12.4 % (ref 11.0–15.0)
Total Lymphocyte: 20 %
WBC: 5.6 10*3/uL (ref 3.8–10.8)

## 2019-08-06 LAB — TSH: TSH: 0.88 mIU/L

## 2019-08-06 LAB — COMPLETE METABOLIC PANEL WITH GFR
AG Ratio: 1.6 (calc) (ref 1.0–2.5)
ALT: 22 U/L (ref 6–29)
AST: 20 U/L (ref 10–35)
Albumin: 4.7 g/dL (ref 3.6–5.1)
Alkaline phosphatase (APISO): 50 U/L (ref 31–125)
BUN: 10 mg/dL (ref 7–25)
CO2: 28 mmol/L (ref 20–32)
Calcium: 9.7 mg/dL (ref 8.6–10.2)
Chloride: 104 mmol/L (ref 98–110)
Creat: 0.84 mg/dL (ref 0.50–1.10)
GFR, Est African American: 96 mL/min/{1.73_m2} (ref >=60)
GFR, Est Non African American: 83 mL/min/{1.73_m2} (ref >=60)
Globulin: 3 g/dL (calc) (ref 1.9–3.7)
Glucose, Bld: 91 mg/dL (ref 65–99)
Potassium: 4 mmol/L (ref 3.5–5.3)
Sodium: 141 mmol/L (ref 135–146)
Total Bilirubin: 0.6 mg/dL (ref 0.2–1.2)
Total Protein: 7.7 g/dL (ref 6.1–8.1)

## 2019-08-06 LAB — HEMOGLOBIN A1C
Hgb A1c MFr Bld: 5.2 % of total Hgb (ref <5.7)
Mean Plasma Glucose: 103 (calc)
eAG (mmol/L): 5.7 (calc)

## 2019-08-06 LAB — LIPID PANEL
Cholesterol: 154 mg/dL (ref <200)
HDL: 48 mg/dL — ABNORMAL LOW (ref >=50)
LDL Cholesterol (Calc): 91 mg/dL (calc)
Non-HDL Cholesterol (Calc): 106 mg/dL (calc) (ref <130)
Total CHOL/HDL Ratio: 3.2 (calc) (ref <5.0)
Triglycerides: 68 mg/dL (ref <150)

## 2019-08-06 LAB — HEPATITIS C ANTIBODY
Hepatitis C Ab: NONREACTIVE
SIGNAL TO CUT-OFF: 0.01 (ref <1.00)

## 2019-08-06 LAB — HIV ANTIBODY (ROUTINE TESTING W REFLEX): HIV 1&2 Ab, 4th Generation: NONREACTIVE

## 2019-08-06 LAB — VITAMIN D 25 HYDROXY (VIT D DEFICIENCY, FRACTURES): Vit D, 25-Hydroxy: 29 ng/mL — ABNORMAL LOW (ref 30–100)

## 2019-08-06 LAB — RPR: RPR Ser Ql: NONREACTIVE

## 2019-10-09 ENCOUNTER — Ambulatory Visit: Payer: BLUE CROSS/BLUE SHIELD | Attending: Internal Medicine

## 2019-10-09 DIAGNOSIS — Z23 Encounter for immunization: Secondary | ICD-10-CM

## 2019-10-09 NOTE — Progress Notes (Signed)
   Covid-19 Vaccination Clinic  Name:  Abigail Massey    MRN: [id number] DOB: 1971-03-21  10/09/2019  [name] was observed post Covid-19 immunization for 15 minutes without incident. She was provided with Vaccine Information Sheet and instruction to access the V-Safe system.   [name] was instructed to call 911 with any severe reactions post vaccine: Marland Kitchen Difficulty breathing  . Swelling of face and throat  . A fast heartbeat  . A bad rash all over body  . Dizziness and weakness   Immunizations Administered    Name Date Dose VIS Date Route   Pfizer COVID-19 Vaccine 10/09/2019  8:27 AM 0.3 mL 06/13/2019 Intramuscular   Manufacturer: [place]   Lot: [id number]   [place]: [id number]

## 2019-11-04 ENCOUNTER — Ambulatory Visit: Payer: BLUE CROSS/BLUE SHIELD | Attending: Internal Medicine

## 2019-11-04 DIAGNOSIS — Z23 Encounter for immunization: Secondary | ICD-10-CM

## 2019-11-04 NOTE — Progress Notes (Signed)
   Covid-19 Vaccination Clinic  Name:  Abigail Massey    MRN: [id number] DOB: Sep 22, 1970  11/04/2019  [name] was observed post Covid-19 immunization for 15 minutes without incident. She was provided with Vaccine Information Sheet and instruction to access the V-Safe system.   [name] was instructed to call 911 with any severe reactions post vaccine: Marland Kitchen Difficulty breathing  . Swelling of face and throat  . A fast heartbeat  . A bad rash all over body  . Dizziness and weakness   Immunizations Administered    Name Date Dose VIS Date Route   Pfizer COVID-19 Vaccine 11/04/2019  8:43 AM 0.3 mL 08/27/2018 Intramuscular   Manufacturer: [place]   Lot: [id number]   [place]: [id number]

## 2020-03-01 ENCOUNTER — Encounter: Payer: Self-pay | Admitting: Medical

## 2020-03-01 ENCOUNTER — Telehealth: Payer: BC Managed Care – PPO | Admitting: Medical

## 2020-03-01 ENCOUNTER — Other Ambulatory Visit: Payer: Self-pay

## 2020-03-01 ENCOUNTER — Ambulatory Visit: Payer: BC Managed Care – PPO | Admitting: *Deleted

## 2020-03-01 DIAGNOSIS — Z20822 Contact with and (suspected) exposure to covid-19: Secondary | ICD-10-CM

## 2020-03-01 DIAGNOSIS — R0981 Nasal congestion: Secondary | ICD-10-CM

## 2020-03-01 DIAGNOSIS — R0989 Other specified symptoms and signs involving the circulatory and respiratory systems: Secondary | ICD-10-CM

## 2020-03-01 DIAGNOSIS — J302 Other seasonal allergic rhinitis: Secondary | ICD-10-CM

## 2020-03-01 LAB — POC COVID19 BINAXNOW: SARS Coronavirus 2 Ag: NEGATIVE

## 2020-03-01 NOTE — Patient Instructions (Signed)

## 2020-03-01 NOTE — Progress Notes (Signed)
   Subjective:    Patient ID: Abigail Massey, female    DOB: May 09, 1971, 49 y.o.   MRN: [id number]  HPI 49 yo female in non acuate distress, consents to telemedicine appointment. Runny nose, muscle aches.both shoulders on Friday. 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 She states she does not feel ill and that her symptoms are normal for her seasonal allergies.  Patient denies fever, chills, fatigue, andy GI symptoms and no loss of taste or smell.       No Known Allergies  Review of Systems  Constitutional: Negative for chills, fatigue and fever.  HENT: Positive for congestion (patient believes this is an allergy symptom) and rhinorrhea (patient believes this is an allegy symptom.). Negative for ear pain, sinus pressure, sinus pain and sore throat.   Respiratory: Negative for cough, shortness of breath and wheezing.   Cardiovascular: Negative for chest pain.  Gastrointestinal: Negative for abdominal pain, diarrhea, nausea and vomiting.  Musculoskeletal: Positive for myalgias (both shoulders on Friday, better now).  Skin: Negative for rash.  Allergic/Immunologic: Positive for environmental allergies.  Neurological: Negative for dizziness, syncope, light-headedness and headaches.   [place] 2021  Last Thursday(02/26/20)  greater amount of exposure, close contact Some contact on Friday.    Patient wore a mask, not distanced 32ft on Thursday.    Objective:   Physical Exam  AXOX3 No physical was performed due to a telemedicine appointment.  Recent Results (from the past 2160 hour(s))  POC COVID-19     Status: Normal   Collection Time: 03/01/20  9:16 AM  Result Value Ref Range   SARS Coronavirus 2 Ag Negative Negative    Comment: Pt aware. Virtual visit completed with H.Damiano Stamper PAC. Not true close contact. Asymptomatic.    Assessment & Plan:  Contacwith and (suspected) exposure to Covid 19 POC Covid-19  Negative.  See FAQs of Covid-19.  Monitor symptoms x 10 days. If any of the symptoms we reviewed occur patient to contact clinic for testing. If any questions or concerns please call the clinic. Patient verbalizes understanding and has no questions at the end of our conversation.

## 2020-05-02 DIAGNOSIS — Z23 Encounter for immunization: Secondary | ICD-10-CM | POA: Diagnosis not present

## 2020-08-30 ENCOUNTER — Other Ambulatory Visit: Payer: Self-pay

## 2020-08-30 ENCOUNTER — Telehealth: Payer: Self-pay

## 2020-08-30 DIAGNOSIS — Z1231 Encounter for screening mammogram for malignant neoplasm of breast: Secondary | ICD-10-CM

## 2020-08-30 NOTE — Telephone Encounter (Signed)
Copied from [place] [id number]. Topic: Referral - Request for Referral >> Aug 30, 2020 11:47 AM Tessa Lerner A wrote: Has patient seen PCP for this complaint? No  *If NO, is insurance requiring patient see PCP for this issue before PCP can refer them?  Referral for which specialty: Imaging  Preferred provider/office: [facility]   Reason for referral: Patient would like to have a mammogram

## 2021-01-10 NOTE — Progress Notes (Signed)
moName: Abigail Massey   MRN: [id number]    DOB: [birth year]   Date:01/11/2021       Progress Note  Subjective  Chief Complaint  Annual Exam  HPI  Patient presents for annual CPE.  Diet: balanced diet  Exercise: needs to exercise 30 minutes five days a week.   [facility] Visit [facility]  AUDIT-C Score 0      Depression: Phq 9 is  negative Depression screen [facility] 2/9 01/11/2021 08/05/2019 05/16/2017 05/03/2016 04/26/2015  Decreased Interest 0 0 0 0 0  Down, Depressed, Hopeless 0 0 0 0 0  PHQ - 2 Score 0 0 0 0 0  Altered sleeping - 0 - - -  Tired, decreased energy - 0 - - -  Change in appetite - 0 - - -  Feeling bad or failure about yourself  - 0 - - -  Trouble concentrating - 0 - - -  Moving slowly or fidgety/restless - 0 - - -  Suicidal thoughts - 0 - - -  PHQ-9 Score - 0 - - -   Hypertension: BP Readings from Last 3 Encounters:  01/11/21 112/62  08/05/19 106/70  06/26/19 120/80   Obesity: Wt Readings from Last 3 Encounters:  01/11/21 160 lb (72.6 kg)  08/05/19 155 lb 11.2 oz (70.6 kg)  06/26/19 160 lb (72.6 kg)   BMI Readings from Last 3 Encounters:  01/11/21 23.63 kg/m  08/05/19 22.99 kg/m  06/26/19 22.96 kg/m     Vaccines:   Pneumonia: educated and discussed with patient. Flu: educated and discussed with patient.  Hep C Screening: 08/05/19 STD testing and prevention (HIV/chl/gon/syphilis): 08/05/19 Intimate partner violence:negative Sexual History : no pain, does not want to be checked for STI Menstrual History/LMP/Abnormal Bleeding: s/p hysterectomy for treatment of uterine fibroids  Incontinence Symptoms: no symptoms   Breast cancer:  - Last Mammogram: repeat yearly, last one was 07/21/2019  - BRCA gene screening: N/A  Osteoporosis: Discussed high calcium and vitamin D supplementation, weight bearing exercises  Cervical cancer screening: 2021- has GYN  Skin cancer: Discussed monitoring for atypical  lesions  Colorectal cancer: 07/17/12  Lung cancer:  Low Dose CT Chest recommended if Age 49-80 years, 20 pack-year currently smoking OR have quit w/in 15years. Patient does not qualify.   ECG: 11/21/17  Advanced Care Planning: A voluntary discussion about advance care planning including the explanation and discussion of advance directives.  Discussed health care proxy and Living will, and the patient was able to identify a health care proxy as mother .    Lipids: Lab Results  Component Value Date   CHOL 154 08/05/2019   CHOL 143 05/16/2017   CHOL 130 05/09/2016   Lab Results  Component Value Date   HDL 48 (L) 08/05/2019   HDL 53 05/16/2017   HDL 47 (L) 05/09/2016   Lab Results  Component Value Date   LDLCALC 91 08/05/2019   LDLCALC 76 05/16/2017   LDLCALC 71 05/09/2016   Lab Results  Component Value Date   TRIG 68 08/05/2019   TRIG 49 05/16/2017   TRIG 62 05/09/2016   Lab Results  Component Value Date   CHOLHDL 3.2 08/05/2019   CHOLHDL 2.7 05/16/2017   CHOLHDL 2.8 05/09/2016   No results found for: LDLDIRECT  Glucose: Glucose, Bld  Date Value Ref Range Status  08/05/2019 91 65 - 99 mg/dL Final    Comment:    .  Fasting reference interval .   05/16/2017 76 65 - 99 mg/dL Final    Comment:    .            Fasting reference interval .   05/09/2016 81 65 - 99 mg/dL Final    Patient Active Problem List   Diagnosis Date Noted   History of nephrolithiasis 07/22/2016   S/P hysterectomy 05/18/2015   Eyelid cyst 04/26/2015    Past Surgical History:  Procedure Laterality Date   ABDOMINAL HYSTERECTOMY  05/18/2015   COLONOSCOPY     LAPAROSCOPIC HYSTERECTOMY N/A 05/18/2015   Procedure: HYSTERECTOMY TOTAL LAPAROSCOPIC/BILATERAL SALPINGECTOMY;  Surgeon: Gae Dry, MD;  Location: [facility] ORS;  Service: Gynecology;  Laterality: N/A;   WISDOM TOOTH EXTRACTION Bilateral     Family History  Problem Relation Age of Onset   Glaucoma Mother     Hypothyroidism Mother    Healthy Father    Healthy Sister    Prostate cancer Paternal Uncle        x 2   Kidney cancer Neg Hx    Bladder Cancer Neg Hx    Breast cancer Neg Hx     Social History   Socioeconomic History   Marital status: Single    Spouse name: Not on file   Number of children: 0   Years of education: Not on file   Highest education level: Master's degree (e.g., MA, MS, MEng, MEd, MSW, MBA)  Occupational History   Occupation: Chief Financial Officer   Tobacco Use   Smoking status: Never   Smokeless tobacco: Never  Vaping Use   Vaping Use: Never used  Substance and Sexual Activity   Alcohol use: No    Alcohol/week: 0.0 standard drinks   Drug use: No   Sexual activity: Not Currently    Partners: Male  Other Topics Concern   Not on file  Social History Narrative   Never married, no children   Social Determinants of Health   Financial Resource Strain: Low Risk    Difficulty of Paying Living Expenses: Not hard at all  Food Insecurity: No Food Insecurity   Worried About Charity fundraiser in the Last Year: Never true   Arboriculturist in the Last Year: Never true  Transportation Needs: No Transportation Needs   Lack of Transportation (Medical): No   Lack of Transportation (Non-Medical): No  Physical Activity: Inactive   Days of Exercise per Week: 0 days   Minutes of Exercise per Session: 0 min  Stress: No Stress Concern Present   Feeling of Stress : Not at all  Social Connections: Socially Isolated   Frequency of Communication with Friends and Family: More than three times a week   Frequency of Social Gatherings with Friends and Family: Once a week   Attends Religious Services: Never   Marine scientist or Organizations: No   Attends Music therapist: Never   Marital Status: Never married  Human resources officer Violence: Not At Risk   Fear of Current or Ex-Partner: No   Emotionally Abused: No   Physically Abused: No   Sexually Abused: No      Current Outpatient Medications:    Multiple Vitamins-Minerals (MULTIVITAMIN WOMEN) TABS, Take by mouth., Disp: , Rfl:    Turmeric (QC TUMERIC COMPLEX PO), Take by mouth., Disp: , Rfl:    Vitamin D, Cholecalciferol, 1000 UNITS TABS, Take by mouth., Disp: , Rfl:   No Known Allergies   ROS  Constitutional: Negative for fever  or weight change.  Respiratory: Negative for cough and shortness of breath.   Cardiovascular: Negative for chest pain or palpitations.  Gastrointestinal: Negative for abdominal pain, no bowel changes.  Musculoskeletal: Negative for gait problem or joint swelling.  Skin: Negative for rash.  Neurological: Negative for dizziness or headache.  No other specific complaints in a complete review of systems (except as listed in HPI above).   Objective  Vitals:   01/11/21 1312  BP: 112/62  Pulse: 97  Resp: 16  Temp: 98.1 F (36.7 C)  TempSrc: Oral  SpO2: 100%  Weight: 160 lb (72.6 kg)  Height: '5\' 9"'  (1.753 m)    Body mass index is 23.63 kg/m.  Physical Exam  Constitutional: Patient appears well-developed and well-nourished. No distress.  HENT: Head: Normocephalic and atraumatic. Ears: B TMs ok, no erythema or effusion; Nose: Nose normal. Mouth/Throat: Oropharynx is clear and moist. No oropharyngeal exudate.  Eyes: Conjunctivae and EOM are normal. Pupils are equal, round, and reactive to light. No scleral icterus.  Neck: Normal range of motion. Neck supple. No JVD present. No thyromegaly present.  Cardiovascular: Normal rate, regular rhythm and normal heart sounds.  No murmur heard. No BLE edema. Pulmonary/Chest: Effort normal and breath sounds normal. No respiratory distress. Abdominal: Soft. Bowel sounds are normal, no distension. There is no tenderness. no masses Breast: no lumps or masses, no nipple discharge or rashes FEMALE GENITALIA:  Not done RECTAL: not done  Musculoskeletal: Normal range of motion, no joint effusions. No gross  deformities Neurological: he is alert and oriented to person, place, and time. No cranial nerve deficit. Coordination, balance, strength, speech and gait are normal.  Skin: Skin is warm and dry. atypical mole on her right upper back ( per patient present for years/birth mark, also has hyperpigmented spot between breasts Psychiatric: Patient has a normal mood and affect. behavior is normal. Judgment and thought content normal.   Fall Risk: Fall Risk  01/11/2021 08/05/2019 11/21/2017 11/21/2017 05/16/2017  Falls in the past year? 0 0 No No No  Number falls in past yr: 0 0 - - -  Injury with Fall? 0 0 - - -  Risk for fall due to : No Fall Risks - - - -  Follow up Falls prevention discussed - - - -     Functional Status Survey: Is the patient deaf or have difficulty hearing?: No Does the patient have difficulty seeing, even when wearing glasses/contacts?: No Does the patient have difficulty concentrating, remembering, or making decisions?: No Does the patient have difficulty walking or climbing stairs?: No Does the patient have difficulty dressing or bathing?: No Does the patient have difficulty doing errands alone such as visiting a doctor's office or shopping?: No   Assessment & Plan  1. Well adult exam  - TSH - Lipid panel - COMPLETE METABOLIC PANEL WITH GFR - CBC with Differential/Platelet - Hemoglobin A1c  2. Encounter for screening mammogram for malignant neoplasm of breast  - MM 3D SCREEN BREAST BILATERAL; Future  3. Lipid screening  - Lipid panel  4. History of anemia  - CBC with Differential/Platelet  5. Vitamin D deficiency  Vitamin D   6. Diabetes mellitus screening  - Hemoglobin A1c  7. Family history of thyroid disease  - TSH   8. Atypical mole  - Ambulatory referral to Dermatology  9. Recent skin changes  - Ambulatory referral to Dermatology   -USPSTF grade A and B recommendations reviewed with patient; age-appropriate recommendations, preventive  care,  screening tests, etc discussed and encouraged; healthy living encouraged; see AVS for patient education given to patient -Discussed importance of 150 minutes of physical activity weekly, eat two servings of fish weekly, eat one serving of tree nuts ( cashews, pistachios, pecans, almonds.Marland Kitchen) every other day, eat 6 servings of fruit/vegetables daily and drink plenty of water and avoid sweet beverages.   -Reviewed Health Maintenance:

## 2021-01-10 NOTE — Patient Instructions (Signed)
Preventive Care 49-49 Years Old, Female Preventive care refers to lifestyle choices and visits with your health care provider that can promote health and wellness. This includes: A yearly physical exam. This is also called an annual wellness visit. Regular dental and eye exams. Immunizations. Screening for certain conditions. Healthy lifestyle choices, such as: Eating a healthy diet. Getting regular exercise. Not using drugs or products that contain nicotine and tobacco. Limiting alcohol use. What can I expect for my preventive care visit? Physical exam Your health care provider will check your: Height and weight. These may be used to calculate your BMI (body mass index). BMI is a measurement that tells if you are at a healthy weight. Heart rate and blood pressure. Body temperature. Skin for abnormal spots. Counseling Your health care provider may ask you questions about your: Past medical problems. Family's medical history. Alcohol, tobacco, and drug use. Emotional well-being. Home life and relationship well-being. Sexual activity. Diet, exercise, and sleep habits. Work and work Statistician. Access to firearms. Method of birth control. Menstrual cycle. Pregnancy history. What immunizations do I need?  Vaccines are usually given at various ages, according to a schedule. Your health care provider will recommend vaccines for you based on your age, medicalhistory, and lifestyle or other factors, such as travel or where you work. What tests do I need? Blood tests Lipid and cholesterol levels. These may be checked every 5 years, or more often if you are over 49 years old. Hepatitis C test. Hepatitis B test. Screening Lung cancer screening. You may have this screening every year starting at age 30 if you have a 30-pack-year history of smoking and currently smoke or have quit within the past 15 years. Colorectal cancer screening. All adults should have this screening starting at  age 23 and continuing until age 3. Your health care provider may recommend screening at age 49 if you are at increased risk. You will have tests every 1-10 years, depending on your results and the type of screening test. Diabetes screening. This is done by checking your blood sugar (glucose) after you have not eaten for a while (fasting). You may have this done every 1-3 years. Mammogram. This may be done every 1-2 years. Talk with your health care provider about when you should start having regular mammograms. This may depend on whether you have a family history of breast cancer. BRCA-related cancer screening. This may be done if you have a family history of breast, ovarian, tubal, or peritoneal cancers. Pelvic exam and Pap test. This may be done every 3 years starting at age 49. Starting at age 54, this may be done every 5 years if you have a Pap test in combination with an HPV test. Other tests STD (sexually transmitted disease) testing, if you are at risk. Bone density scan. This is done to screen for osteoporosis. You may have this scan if you are at high risk for osteoporosis. Talk with your health care provider about your test results, treatment options,and if necessary, the need for more tests. Follow these instructions at home: Eating and drinking  Eat a diet that includes fresh fruits and vegetables, whole grains, lean protein, and low-fat dairy products. Take vitamin and mineral supplements as recommended by your health care provider. Do not drink alcohol if: Your health care provider tells you not to drink. You are pregnant, may be pregnant, or are planning to become pregnant. If you drink alcohol: Limit how much you have to 0-1 drink a day. Be aware  of how much alcohol is in your drink. In the [place] one drink equals one 12 oz bottle of beer (355 mL), one 5 oz glass of wine (148 mL), or one 1 oz glass of hard liquor (44 mL).  Lifestyle Take daily care of your teeth and  gums. Brush your teeth every morning and night with fluoride toothpaste. Floss one time each day. Stay active. Exercise for at least 30 minutes 5 or more days each week. Do not use any products that contain nicotine or tobacco, such as cigarettes, e-cigarettes, and chewing tobacco. If you need help quitting, ask your health care provider. Do not use drugs. If you are sexually active, practice safe sex. Use a condom or other form of protection to prevent STIs (sexually transmitted infections). If you do not wish to become pregnant, use a form of birth control. If you plan to become pregnant, see your health care provider for a prepregnancy visit. If told by your health care provider, take low-dose aspirin daily starting at age 49. Find healthy ways to cope with stress, such as: Meditation, yoga, or listening to music. Journaling. Talking to a trusted person. Spending time with friends and family. Safety Always wear your seat belt while driving or riding in a vehicle. Do not drive: If you have been drinking alcohol. Do not ride with someone who has been drinking. When you are tired or distracted. While texting. Wear a helmet and other protective equipment during sports activities. If you have firearms in your house, make sure you follow all gun safety procedures. What's next? Visit your health care provider once a year for an annual wellness visit. Ask your health care provider how often you should have your eyes and teeth checked. Stay up to date on all vaccines. This information is not intended to replace advice given to you by your health care provider. Make sure you discuss any questions you have with your healthcare provider. Document Revised: 03/23/2020 Document Reviewed: 02/28/2018 Elsevier Patient Education  2022 [employer].

## 2021-01-11 ENCOUNTER — Encounter: Payer: Self-pay | Admitting: Family Medicine

## 2021-01-11 ENCOUNTER — Ambulatory Visit (INDEPENDENT_AMBULATORY_CARE_PROVIDER_SITE_OTHER): Payer: BC Managed Care – PPO | Admitting: Family Medicine

## 2021-01-11 ENCOUNTER — Other Ambulatory Visit: Payer: Self-pay

## 2021-01-11 VITALS — BP 112/62 | HR 97 | Temp 98.1°F | Resp 16 | Ht 69.0 in | Wt 160.0 lb

## 2021-01-11 DIAGNOSIS — Z1231 Encounter for screening mammogram for malignant neoplasm of breast: Secondary | ICD-10-CM

## 2021-01-11 DIAGNOSIS — E559 Vitamin D deficiency, unspecified: Secondary | ICD-10-CM

## 2021-01-11 DIAGNOSIS — Z862 Personal history of diseases of the blood and blood-forming organs and certain disorders involving the immune mechanism: Secondary | ICD-10-CM

## 2021-01-11 DIAGNOSIS — Z0000 Encounter for general adult medical examination without abnormal findings: Secondary | ICD-10-CM | POA: Diagnosis not present

## 2021-01-11 DIAGNOSIS — R239 Unspecified skin changes: Secondary | ICD-10-CM

## 2021-01-11 DIAGNOSIS — Z8349 Family history of other endocrine, nutritional and metabolic diseases: Secondary | ICD-10-CM | POA: Diagnosis not present

## 2021-01-11 DIAGNOSIS — Z131 Encounter for screening for diabetes mellitus: Secondary | ICD-10-CM | POA: Diagnosis not present

## 2021-01-11 DIAGNOSIS — Z13220 Encounter for screening for lipoid disorders: Secondary | ICD-10-CM | POA: Diagnosis not present

## 2021-01-11 DIAGNOSIS — D229 Melanocytic nevi, unspecified: Secondary | ICD-10-CM

## 2021-01-12 LAB — COMPLETE METABOLIC PANEL WITH GFR
AG Ratio: 1.7 (calc) (ref 1.0–2.5)
ALT: 21 U/L (ref 6–29)
AST: 16 U/L (ref 10–35)
Albumin: 4.4 g/dL (ref 3.6–5.1)
Alkaline phosphatase (APISO): 46 U/L (ref 31–125)
BUN: 11 mg/dL (ref 7–25)
CO2: 30 mmol/L (ref 20–32)
Calcium: 9.6 mg/dL (ref 8.6–10.2)
Chloride: 104 mmol/L (ref 98–110)
Creat: 0.78 mg/dL (ref 0.50–0.99)
Globulin: 2.6 g/dL (calc) (ref 1.9–3.7)
Glucose, Bld: 83 mg/dL (ref 65–139)
Potassium: 4.4 mmol/L (ref 3.5–5.3)
Sodium: 141 mmol/L (ref 135–146)
Total Bilirubin: 0.6 mg/dL (ref 0.2–1.2)
Total Protein: 7 g/dL (ref 6.1–8.1)
eGFR: 94 mL/min/{1.73_m2} (ref >=60)

## 2021-01-12 LAB — LIPID PANEL
Cholesterol: 150 mg/dL (ref <200)
HDL: 44 mg/dL — ABNORMAL LOW (ref >=50)
LDL Cholesterol (Calc): 89 mg/dL (calc)
Non-HDL Cholesterol (Calc): 106 mg/dL (calc) (ref <130)
Total CHOL/HDL Ratio: 3.4 (calc) (ref <5.0)
Triglycerides: 82 mg/dL (ref <150)

## 2021-01-12 LAB — TSH: TSH: 0.8 mIU/L

## 2021-01-12 LAB — CBC WITH DIFFERENTIAL/PLATELET
Absolute Monocytes: 432 cells/uL (ref 200–950)
Basophils Absolute: 12 cells/uL (ref 0–200)
Basophils Relative: 0.2 %
Eosinophils Absolute: 102 cells/uL (ref 15–500)
Eosinophils Relative: 1.7 %
HCT: 43.3 % (ref 35.0–45.0)
Hemoglobin: 14 g/dL (ref 11.7–15.5)
Lymphs Abs: 1140 cells/uL (ref 850–3900)
MCH: 27.5 pg (ref 27.0–33.0)
MCHC: 32.3 g/dL (ref 32.0–36.0)
MCV: 85.1 fL (ref 80.0–100.0)
MPV: 11.1 fL (ref 7.5–12.5)
Monocytes Relative: 7.2 %
Neutro Abs: 4314 cells/uL (ref 1500–7800)
Neutrophils Relative %: 71.9 %
Platelets: 213 10*3/uL (ref 140–400)
RBC: 5.09 10*6/uL (ref 3.80–5.10)
RDW: 12.3 % (ref 11.0–15.0)
Total Lymphocyte: 19 %
WBC: 6 10*3/uL (ref 3.8–10.8)

## 2021-01-12 LAB — HEMOGLOBIN A1C
Hgb A1c MFr Bld: 5.2 % of total Hgb (ref <5.7)
Mean Plasma Glucose: 103 mg/dL
eAG (mmol/L): 5.7 mmol/L

## 2021-01-12 LAB — VITAMIN D 25 HYDROXY (VIT D DEFICIENCY, FRACTURES): Vit D, 25-Hydroxy: 36 ng/mL (ref 30–100)

## 2021-01-17 ENCOUNTER — Ambulatory Visit
Admission: RE | Admit: 2021-01-17 | Discharge: 2021-01-17 | Disposition: A | Discharge status: Home / Self Care | Payer: BC Managed Care – PPO | Source: Ambulatory Visit | Attending: Family Medicine | Admitting: Family Medicine

## 2021-01-17 ENCOUNTER — Other Ambulatory Visit: Payer: Self-pay

## 2021-01-17 DIAGNOSIS — Z1231 Encounter for screening mammogram for malignant neoplasm of breast: Secondary | ICD-10-CM | POA: Diagnosis not present

## 2021-04-15 DIAGNOSIS — Z23 Encounter for immunization: Secondary | ICD-10-CM | POA: Diagnosis not present

## 2021-09-23 ENCOUNTER — Ambulatory Visit: Payer: BC Managed Care – PPO | Admitting: Obstetrics & Gynecology

## 2021-10-05 ENCOUNTER — Ambulatory Visit (INDEPENDENT_AMBULATORY_CARE_PROVIDER_SITE_OTHER): Payer: BC Managed Care – PPO | Admitting: Obstetrics & Gynecology

## 2021-10-05 ENCOUNTER — Telehealth: Payer: Self-pay

## 2021-10-05 ENCOUNTER — Other Ambulatory Visit (HOSPITAL_COMMUNITY)
Admission: RE | Admit: 2021-10-05 | Discharge: 2021-10-05 | Disposition: A | Discharge status: Home / Self Care | Payer: BC Managed Care – PPO | Source: Ambulatory Visit | Attending: Obstetrics & Gynecology | Admitting: Obstetrics & Gynecology

## 2021-10-05 ENCOUNTER — Encounter: Payer: Self-pay | Admitting: Obstetrics & Gynecology

## 2021-10-05 VITALS — BP 120/70 | Ht 70.0 in | Wt 157.0 lb

## 2021-10-05 DIAGNOSIS — Z01419 Encounter for gynecological examination (general) (routine) without abnormal findings: Secondary | ICD-10-CM

## 2021-10-05 DIAGNOSIS — Z1272 Encounter for screening for malignant neoplasm of vagina: Secondary | ICD-10-CM

## 2021-10-05 DIAGNOSIS — Z1211 Encounter for screening for malignant neoplasm of colon: Secondary | ICD-10-CM | POA: Diagnosis not present

## 2021-10-05 NOTE — Telephone Encounter (Signed)
CALLED PATIENT NO ANSWER LEFT VOICEMAIL FOR A CALL BACK ? ?

## 2021-10-05 NOTE — Progress Notes (Signed)
? ?HPI: ?     Abigail Massey is a 51 y.o. G0P0000 who LMP was Patient's last menstrual period was 05/03/2015., she presents today for her annual examination. The patient has no complaints today. The patient is sexually active. Her last pap: approximate date 2018 (prior Lap Hyst) and was normal and last mammogram: approximate date 2022 and was normal. The patient does perform self breast exams.  There is no notable family history of breast or ovarian cancer in her family.  The patient has regular exercise: yes.  The patient denies current symptoms of depression.   ? ?GYN History: ?Contraception: status post hysterectomy ? ?PMHx: ?Past Medical History:  ?Diagnosis Date  ? Fibroids   ? Heavy menstrual bleeding   ? Mitral prolapse   ? ?Past Surgical History:  ?Procedure Laterality Date  ? ABDOMINAL HYSTERECTOMY  05/18/2015  ? COLONOSCOPY    ? LAPAROSCOPIC HYSTERECTOMY N/A 05/18/2015  ? Procedure: HYSTERECTOMY TOTAL LAPAROSCOPIC/BILATERAL SALPINGECTOMY;  Surgeon: Gae Dry, MD;  Location: [facility] ORS;  Service: Gynecology;  Laterality: N/A;  ? WISDOM TOOTH EXTRACTION Bilateral   ? ?Family History  ?Problem Relation Age of Onset  ? Glaucoma Mother   ? Hypothyroidism Mother   ? Healthy Father   ? Healthy Sister   ? Prostate cancer Paternal Uncle   ?     x 2  ? Breast cancer Cousin 76  ? Colon cancer Cousin 71  ? Kidney cancer Neg Hx   ? Bladder Cancer Neg Hx   ? ?Social History  ? ?Tobacco Use  ? Smoking status: Never  ? Smokeless tobacco: Never  ?Vaping Use  ? Vaping Use: Never used  ?Substance Use Topics  ? Alcohol use: No  ?  Alcohol/week: 0.0 standard drinks  ? Drug use: No  ? ? ?Current Outpatient Medications:  ?  Multiple Vitamins-Minerals (MULTIVITAMIN WOMEN) TABS, Take by mouth., Disp: , Rfl:  ?  Turmeric (QC TUMERIC COMPLEX PO), Take by mouth., Disp: , Rfl:  ?  Vitamin D, Cholecalciferol, 1000 UNITS TABS, Take by mouth., Disp: , Rfl:  ?Allergies: Patient has no known allergies. ? ?Review of Systems   ?Constitutional:  Negative for chills, fever and malaise/fatigue.  ?HENT:  Negative for congestion, sinus pain and sore throat.   ?Eyes:  Negative for blurred vision and pain.  ?Respiratory:  Negative for cough and wheezing.   ?Cardiovascular:  Negative for chest pain and leg swelling.  ?Gastrointestinal:  Negative for abdominal pain, constipation, diarrhea, heartburn, nausea and vomiting.  ?Genitourinary:  Negative for dysuria, frequency, hematuria and urgency.  ?Musculoskeletal:  Negative for back pain, joint pain, myalgias and neck pain.  ?Skin:  Negative for itching and rash.  ?Neurological:  Negative for dizziness, tremors and weakness.  ?Endo/Heme/Allergies:  Does not bruise/bleed easily.  ?Psychiatric/Behavioral:  Negative for depression. The patient is not nervous/anxious and does not have insomnia.   ? ?Objective: ?BP 120/70   Ht '5\' 10"'$  (1.778 m)   Wt 157 lb (71.2 kg)   LMP 05/03/2015   BMI 22.53 kg/m?   ?Filed Weights  ? 10/05/21 0850  ?Weight: 157 lb (71.2 kg)  ? Body mass index is 22.53 kg/m?Marland Kitchen ?Physical Exam ?Constitutional:   ?   General: She is not in acute distress. ?   Appearance: She is well-developed.  ?Genitourinary:  ?   Vulva, bladder, rectum and urethral meatus normal.  ?   No lesions in the vagina.  ?   Genitourinary Comments: Vaginal cuff well healed  ?  Right Labia: No rash, tenderness or lesions. ?   Left Labia: No tenderness, lesions or rash. ?   No vaginal bleeding.  ? ?   Right Adnexa: not tender and no mass present. ?   Left Adnexa: not tender and no mass present. ?   Cervix is absent.  ?   Uterus is absent.  ?   Pelvic exam was performed with patient in the lithotomy position.  ?Breasts: ?   Right: No mass, skin change or tenderness.  ?   Left: No mass, skin change or tenderness.  ?HENT:  ?   Head: Normocephalic and atraumatic. No laceration.  ?   Right Ear: Hearing normal.  ?   Left Ear: Hearing normal.  ?   Mouth/Throat:  ?   Pharynx: Uvula midline.  ?Eyes:  ?   Pupils: Pupils  are equal, round, and reactive to light.  ?Neck:  ?   Thyroid: No thyromegaly.  ?Cardiovascular:  ?   Rate and Rhythm: Normal rate and regular rhythm.  ?   Heart sounds: No murmur heard. ?  No friction rub. No gallop.  ?Pulmonary:  ?   Effort: Pulmonary effort is normal. No respiratory distress.  ?   Breath sounds: Normal breath sounds. No wheezing.  ?Abdominal:  ?   General: Bowel sounds are normal. There is no distension.  ?   Palpations: Abdomen is soft.  ?   Tenderness: There is no abdominal tenderness. There is no rebound.  ?Musculoskeletal:     ?   General: Normal range of motion.  ?   Cervical back: Normal range of motion and neck supple.  ?Neurological:  ?   Mental Status: She is alert and oriented to person, place, and time.  ?   Cranial Nerves: No cranial nerve deficit.  ?Skin: ?   General: Skin is warm and dry.  ?Psychiatric:     ?   Judgment: Judgment normal.  ?Vitals reviewed.  ? ? ?Assessment:  ANNUAL EXAM ?1. Women's annual routine gynecological examination   ?2. Screening for vaginal cancer   ?3. Screen for colon cancer   ? ? ? ?Screening Plan: ?           ?1.  Vaginal Screening-  Pap smear done today ? ?2. Breast screening- Exam annually and mammogram>40 planned  ? ?3. Colonoscopy every 10 years, Hemoccult testing - after age 41, referral made ? ?4. Labs managed by PCP ? ? ?  F/U ? Return for and Annual when due. ? ?Barnett Applebaum, MD, [place] ?[facility] [place] ?10/05/2021  9:15 AM ? ? ?

## 2021-10-05 NOTE — Patient Instructions (Signed)
PAP every 5 years ?Mammogram every year ?   Call [phone] to schedule at [facility] ?Colonoscopy every 10 years - soon ?Labs yearly (with PCP) ? ?Thank you for choosing [facility]. As part of our ongoing efforts to improve patient experience, we would appreciate your feedback. Please fill out the short survey that you will receive by mail or MyChart. Your opinion is important to [place]! ?- Dr. Kenton Kingfisher ? ?

## 2021-10-06 LAB — CYTOLOGY - PAP: Diagnosis: NEGATIVE

## 2021-10-11 ENCOUNTER — Telehealth: Payer: Self-pay

## 2021-10-11 NOTE — Telephone Encounter (Signed)
CALLED PATIENT NO ANSWER LEFT VOICEMAIL FOR A CALL BACK LETTER SENT 

## 2021-12-23 ENCOUNTER — Other Ambulatory Visit: Payer: Self-pay | Admitting: Family Medicine

## 2021-12-23 DIAGNOSIS — Z1231 Encounter for screening mammogram for malignant neoplasm of breast: Secondary | ICD-10-CM

## 2022-01-12 ENCOUNTER — Encounter: Payer: BC Managed Care – PPO | Admitting: Family Medicine

## 2022-01-18 ENCOUNTER — Ambulatory Visit
Admission: RE | Admit: 2022-01-18 | Discharge: 2022-01-18 | Disposition: A | Discharge status: Home / Self Care | Payer: BC Managed Care – PPO | Source: Ambulatory Visit | Attending: Family Medicine | Admitting: Family Medicine

## 2022-01-18 DIAGNOSIS — Z1231 Encounter for screening mammogram for malignant neoplasm of breast: Secondary | ICD-10-CM | POA: Diagnosis not present

## 2022-04-12 NOTE — Patient Instructions (Addendum)
[facility]: [phone]   Preventive Care 50-50 Years Old, Female Preventive care refers to lifestyle choices and visits with your health care provider that can promote health and wellness. Preventive care visits are also called wellness exams. What can I expect for my preventive care visit? Counseling Your health care provider may ask you questions about your: Medical history, including: Past medical problems. Family medical history. Pregnancy history. Current health, including: Menstrual cycle. Method of birth control. Emotional well-being. Home life and relationship well-being. Sexual activity and sexual health. Lifestyle, including: Alcohol, nicotine or tobacco, and drug use. Access to firearms. Diet, exercise, and sleep habits. Work and work Statistician. Sunscreen use. Safety issues such as seatbelt and bike helmet use. Physical exam Your health care provider will check your: Height and weight. These may be used to calculate your BMI (body mass index). BMI is a measurement that tells if you are at a healthy weight. Waist circumference. This measures the distance around your waistline. This measurement also tells if you are at a healthy weight and may help predict your risk of certain diseases, such as type 2 diabetes and high blood pressure. Heart rate and blood pressure. Body temperature. Skin for abnormal spots. What immunizations do I need?  Vaccines are usually given at various ages, according to a schedule. Your health care provider will recommend vaccines for you based on your age, medical history, and lifestyle or other factors, such as travel or where you work. What tests do I need? Screening Your health care provider may recommend screening tests for certain conditions. This may include: Lipid and cholesterol levels. Diabetes screening. This is done by checking your blood sugar (glucose) after you have not eaten for a while (fasting). Pelvic exam and Pap  test. Hepatitis B test. Hepatitis C test. HIV (human immunodeficiency virus) test. STI (sexually transmitted infection) testing, if you are at risk. Lung cancer screening. Colorectal cancer screening. Mammogram. Talk with your health care provider about when you should start having regular mammograms. This may depend on whether you have a family history of breast cancer. BRCA-related cancer screening. This may be done if you have a family history of breast, ovarian, tubal, or peritoneal cancers. Bone density scan. This is done to screen for osteoporosis. Talk with your health care provider about your test results, treatment options, and if necessary, the need for more tests. Follow these instructions at home: Eating and drinking  Eat a diet that includes fresh fruits and vegetables, whole grains, lean protein, and low-fat dairy products. Take vitamin and mineral supplements as recommended by your health care provider. Do not drink alcohol if: Your health care provider tells you not to drink. You are pregnant, may be pregnant, or are planning to become pregnant. If you drink alcohol: Limit how much you have to 0-1 drink a day. Know how much alcohol is in your drink. In the [place] one drink equals one 12 oz bottle of beer (355 mL), one 5 oz glass of wine (148 mL), or one 1 oz glass of hard liquor (44 mL). Lifestyle Brush your teeth every morning and night with fluoride toothpaste. Floss one time each day. Exercise for at least 30 minutes 5 or more days each week. Do not use any products that contain nicotine or tobacco. These products include cigarettes, chewing tobacco, and vaping devices, such as e-cigarettes. If you need help quitting, ask your health care provider. Do not use drugs. If you are sexually active, practice safe sex. Use a condom or  other form of protection to prevent STIs. If you do not wish to become pregnant, use a form of birth control. If you plan to become pregnant,  see your health care provider for a prepregnancy visit. Take aspirin only as told by your health care provider. Make sure that you understand how much to take and what form to take. Work with your health care provider to find out whether it is safe and beneficial for you to take aspirin daily. Find healthy ways to manage stress, such as: Meditation, yoga, or listening to music. Journaling. Talking to a trusted person. Spending time with friends and family. Minimize exposure to UV radiation to reduce your risk of skin cancer. Safety Always wear your seat belt while driving or riding in a vehicle. Do not drive: If you have been drinking alcohol. Do not ride with someone who has been drinking. When you are tired or distracted. While texting. If you have been using any mind-altering substances or drugs. Wear a helmet and other protective equipment during sports activities. If you have firearms in your house, make sure you follow all gun safety procedures. Seek help if you have been physically or sexually abused. What's next? Visit your health care provider once a year for an annual wellness visit. Ask your health care provider how often you should have your eyes and teeth checked. Stay up to date on all vaccines. This information is not intended to replace advice given to you by your health care provider. Make sure you discuss any questions you have with your health care provider. Document Revised: 12/15/2020 Document Reviewed: 12/15/2020 Elsevier Patient Education  [place].

## 2022-04-12 NOTE — Progress Notes (Signed)
Name: Abigail Massey   MRN: [id number]    DOB: 08-16-1970   Date:04/13/2022       Progress Note  Subjective  Chief Complaint  Annual Exam  HPI  Patient presents for annual CPE.  Diet: balanced, rich and fruit and vegetables, lean meat  Exercise: she will increase   Last Eye Exam: due for eye exam  Last Dental Exam: every 6 months   [place] Visit from 01/11/2021 in [facility]  AUDIT-C Score 0      Depression: Phq 9 is  negative    04/13/2022    8:30 AM 01/11/2021    1:03 PM 08/05/2019    9:06 AM 05/16/2017    8:47 AM 05/03/2016   11:40 AM  Depression screen PHQ 2/9  Decreased Interest 0 0 0 0 0  Down, Depressed, Hopeless 0 0 0 0 0  PHQ - 2 Score 0 0 0 0 0  Altered sleeping 0  0    Tired, decreased energy 0  0    Change in appetite 0  0    Feeling bad or failure about yourself  0  0    Trouble concentrating 0  0    Moving slowly or fidgety/restless 0  0    Suicidal thoughts 0  0    PHQ-9 Score 0  0     Hypertension: BP Readings from Last 3 Encounters:  04/13/22 118/70  10/05/21 120/70  01/11/21 112/62   Obesity: Wt Readings from Last 3 Encounters:  04/13/22 161 lb 12.8 oz (73.4 kg)  10/05/21 157 lb (71.2 kg)  01/11/21 160 lb (72.6 kg)   BMI Readings from Last 3 Encounters:  04/13/22 25.34 kg/m  10/05/21 22.53 kg/m  01/11/21 23.63 kg/m     Vaccines:   HPV: N/A Tdap: today  Shingrix: discussed getting it when she turns 51 yo  Pneumonia: N/A Flu: due - she would like to wait to get flu shot in 2 weeks  COVID-19: discussed booster    Hep C Screening: 08/05/19 STD testing and prevention (HIV/chl/gon/syphilis): 08/05/19 Intimate partner violence: negative screen  Sexual History :not currently sexually active in a long time  Menstrual History/LMP/Abnormal Bleeding: supra cervical hysterectomy, last pap smear normal  Discussed importance of follow up if any post-menopausal bleeding: yes  Incontinence Symptoms: negative  for symptoms   Breast cancer:  - Last Mammogram: 01/18/22 - BRCA gene screening: N/A  Osteoporosis Prevention : Discussed high calcium and vitamin D supplementation, weight bearing exercises Bone density: N/A  Cervical cancer screening: N/A  Skin cancer: Discussed monitoring for atypical lesions  Colorectal cancer: 07/17/12   Lung cancer:  Low Dose CT Chest recommended if Age 50-80 years, 15 pack-year currently smoking OR have quit w/in 15years. Patient does not qualify for screen   ECG: 11/21/17  Advanced Care Planning: A voluntary discussion about advance care planning including the explanation and discussion of advance directives.  Discussed health care proxy and Living will, and the patient was able to identify a health care proxy as mother .  Patient does not have a living will and power of attorney of health care   Lipids: Lab Results  Component Value Date   CHOL 150 01/11/2021   CHOL 154 08/05/2019   CHOL 143 05/16/2017   Lab Results  Component Value Date   HDL 44 (L) 01/11/2021   HDL 48 (L) 08/05/2019   HDL 53 05/16/2017   Lab Results  Component Value Date  LDLCALC 89 01/11/2021   LDLCALC 91 08/05/2019   LDLCALC 76 05/16/2017   Lab Results  Component Value Date   TRIG 82 01/11/2021   TRIG 68 08/05/2019   TRIG 49 05/16/2017   Lab Results  Component Value Date   CHOLHDL 3.4 01/11/2021   CHOLHDL 3.2 08/05/2019   CHOLHDL 2.7 05/16/2017   No results found for: "LDLDIRECT"  Glucose: Glucose, Bld  Date Value Ref Range Status  01/11/2021 83 65 - 139 mg/dL Final    Comment:    .        Non-fasting reference interval .   08/05/2019 91 65 - 99 mg/dL Final    Comment:    .            Fasting reference interval .   05/16/2017 76 65 - 99 mg/dL Final    Comment:    .            Fasting reference interval .     Patient Active Problem List   Diagnosis Date Noted   History of nephrolithiasis 07/22/2016   S/P hysterectomy 05/18/2015   Eyelid cyst  04/26/2015    Past Surgical History:  Procedure Laterality Date   ABDOMINAL HYSTERECTOMY  05/18/2015   COLONOSCOPY     LAPAROSCOPIC HYSTERECTOMY N/A 05/18/2015   Procedure: HYSTERECTOMY TOTAL LAPAROSCOPIC/BILATERAL SALPINGECTOMY;  Surgeon: Gae Dry, MD;  Location: [facility] ORS;  Service: Gynecology;  Laterality: N/A;   WISDOM TOOTH EXTRACTION Bilateral     Family History  Problem Relation Age of Onset   Glaucoma Mother    Hypothyroidism Mother    Healthy Father    Healthy Sister    Prostate cancer Paternal Uncle        x 2   Breast cancer Cousin 27   Colon cancer Cousin 102   Kidney cancer Neg Hx    Bladder Cancer Neg Hx     Social History   Socioeconomic History   Marital status: Single    Spouse name: Not on file   Number of children: 0   Years of education: Not on file   Highest education level: Master's degree (e.g., MA, MS, MEng, MEd, MSW, MBA)  Occupational History   Occupation: Chief Financial Officer   Tobacco Use   Smoking status: Never   Smokeless tobacco: Never  Vaping Use   Vaping Use: Never used  Substance and Sexual Activity   Alcohol use: No    Alcohol/week: 0.0 standard drinks of alcohol   Drug use: No   Sexual activity: Not Currently    Partners: Male  Other Topics Concern   Not on file  Social History Narrative   Never married, no children   Social Determinants of Health   Financial Resource Strain: Low Risk  (04/13/2022)   Overall Financial Resource Strain (CARDIA)    Difficulty of Paying Living Expenses: Not hard at all  Food Insecurity: No Food Insecurity (04/13/2022)   Hunger Vital Sign    Worried About Running Out of Food in the Last Year: Never true    [place] in the Last Year: Never true  Transportation Needs: No Transportation Needs (04/13/2022)   PRAPARE - Hydrologist (Medical): No    Lack of Transportation (Non-Medical): No  Physical Activity: Insufficiently Active (04/13/2022)   Exercise Vital Sign     Days of Exercise per Week: 2 days    Minutes of Exercise per Session: 20 min  Stress: No Stress Concern Present (  04/13/2022)   [place]    Feeling of Stress : Only a little  Social Connections: Socially Isolated (04/13/2022)   Social Connection and Isolation Panel [NHANES]    Frequency of Communication with Friends and Family: Once a week    Frequency of Social Gatherings with Friends and Family: Once a week    Attends Religious Services: Never    Marine scientist or Organizations: No    Attends Archivist Meetings: Never    Marital Status: Never married  Intimate Partner Violence: Not At Risk (04/13/2022)   Humiliation, Afraid, Rape, and Kick questionnaire    Fear of Current or Ex-Partner: No    Emotionally Abused: No    Physically Abused: No    Sexually Abused: No     Current Outpatient Medications:    Multiple Vitamins-Minerals (MULTIVITAMIN WOMEN) TABS, Take by mouth., Disp: , Rfl:    Vitamin D, Cholecalciferol, 1000 UNITS TABS, Take by mouth., Disp: , Rfl:    Turmeric (QC TUMERIC COMPLEX PO), Take by mouth. (Patient not taking: Reported on 04/13/2022), Disp: , Rfl:   No Known Allergies   ROS  Constitutional: Negative for fever or weight change.  Respiratory: Negative for cough and shortness of breath.   Cardiovascular: Negative for chest pain or palpitations.  Gastrointestinal: Negative for abdominal pain, no bowel changes.  Musculoskeletal: Negative for gait problem or joint swelling.  Skin: Negative for rash.  Neurological: Negative for dizziness or headache.  No other specific complaints in a complete review of systems (except as listed in HPI above).   Objective  Vitals:   04/13/22 0821  BP: 118/70  Pulse: 99  Resp: 14  Temp: 98.4 F (36.9 C)  TempSrc: Oral  SpO2: 100%  Weight: 161 lb 12.8 oz (73.4 kg)  Height: $Remove'5\' 7"'IDAANOe$  (1.702 m)    Body mass index is 25.34  kg/m.  Physical Exam  Constitutional: Patient appears well-developed and well-nourished. No distress.  HENT: Head: Normocephalic and atraumatic. Ears: B TMs ok, no erythema or effusion; Nose: Nose normal. Mouth/Throat: Oropharynx is clear and moist. No oropharyngeal exudate.  Eyes: Conjunctivae and EOM are normal. Pupils are equal, round, and reactive to light. No scleral icterus.  Neck: Normal range of motion. Neck supple. No JVD present. No thyromegaly present.  Cardiovascular: Normal rate, regular rhythm and normal heart sounds.  No murmur heard. No BLE edema. Pulmonary/Chest: Effort normal and breath sounds normal. No respiratory distress. Abdominal: Soft. Bowel sounds are normal, no distension. There is no tenderness. no masses Breast: no lumps or masses, no nipple discharge or rashes FEMALE GENITALIA:  Not done  RECTAL: not done  Musculoskeletal: Normal range of motion, no joint effusions. No gross deformities Neurological: he is alert and oriented to person, place, and time. No cranial nerve deficit. Coordination, balance, strength, speech and gait are normal.  Skin: Skin is warm and dry. No rash noted. No erythema.  Psychiatric: Patient has a normal mood and affect. behavior is normal. Judgment and thought content normal.   Fall Risk:    04/13/2022    8:15 AM 01/11/2021    1:03 PM 08/05/2019    8:55 AM 11/21/2017    3:17 PM 11/21/2017    3:04 PM  Fall Risk   Falls in the past year? 0 0 0 No No  Number falls in past yr:  0 0    Injury with Fall?  0 0    Risk for fall  due to : No Fall Risks No Fall Risks     Follow up Falls prevention discussed;Education provided;Falls evaluation completed Falls prevention discussed        Functional Status Survey: Is the patient deaf or have difficulty hearing?: No Does the patient have difficulty seeing, even when wearing glasses/contacts?: No Does the patient have difficulty concentrating, remembering, or making decisions?: No Does the  patient have difficulty walking or climbing stairs?: No Does the patient have difficulty dressing or bathing?: No Does the patient have difficulty doing errands alone such as visiting a doctor's office or shopping?: No   Assessment & Plan  1. Well adult exam  - Tdap vaccine greater than or equal to 7yo IM - Ambulatory referral to Gastroenterology - Lipid panel - CBC with Differential/Platelet - COMPLETE METABOLIC PANEL WITH GFR - Hemoglobin A1c - TSH  2. Need for immunization against influenza   3. Need for Tdap vaccination  - Tdap vaccine greater than or equal to 7yo IM  4. Colon cancer screening  - Ambulatory referral to Gastroenterology  5. Lipid screening  - Lipid panel  6. Diabetes mellitus screening  - Hemoglobin A1c  7. History of anemia  - CBC with Differential/Platelet  8. Family history of thyroid disease  - TSH    -USPSTF grade A and B recommendations reviewed with patient; age-appropriate recommendations, preventive care, screening tests, etc discussed and encouraged; healthy living encouraged; see AVS for patient education given to patient -Discussed importance of 150 minutes of physical activity weekly, eat two servings of fish weekly, eat one serving of tree nuts ( cashews, pistachios, pecans, almonds.Marland Kitchen) every other day, eat 6 servings of fruit/vegetables daily and drink plenty of water and avoid sweet beverages.   -Reviewed Health Maintenance: Yes.

## 2022-04-13 ENCOUNTER — Encounter: Payer: Self-pay | Admitting: Family Medicine

## 2022-04-13 ENCOUNTER — Ambulatory Visit (INDEPENDENT_AMBULATORY_CARE_PROVIDER_SITE_OTHER): Payer: BC Managed Care – PPO | Admitting: Family Medicine

## 2022-04-13 VITALS — BP 118/70 | HR 99 | Temp 98.4°F | Resp 14 | Ht 67.0 in | Wt 161.8 lb

## 2022-04-13 DIAGNOSIS — Z23 Encounter for immunization: Secondary | ICD-10-CM | POA: Diagnosis not present

## 2022-04-13 DIAGNOSIS — Z862 Personal history of diseases of the blood and blood-forming organs and certain disorders involving the immune mechanism: Secondary | ICD-10-CM | POA: Diagnosis not present

## 2022-04-13 DIAGNOSIS — Z8349 Family history of other endocrine, nutritional and metabolic diseases: Secondary | ICD-10-CM | POA: Diagnosis not present

## 2022-04-13 DIAGNOSIS — Z0000 Encounter for general adult medical examination without abnormal findings: Secondary | ICD-10-CM

## 2022-04-13 DIAGNOSIS — Z131 Encounter for screening for diabetes mellitus: Secondary | ICD-10-CM

## 2022-04-13 DIAGNOSIS — Z13220 Encounter for screening for lipoid disorders: Secondary | ICD-10-CM

## 2022-04-13 DIAGNOSIS — Z1211 Encounter for screening for malignant neoplasm of colon: Secondary | ICD-10-CM | POA: Diagnosis not present

## 2022-04-14 ENCOUNTER — Telehealth: Payer: Self-pay | Admitting: *Deleted

## 2022-04-14 ENCOUNTER — Other Ambulatory Visit: Payer: Self-pay | Admitting: *Deleted

## 2022-04-14 DIAGNOSIS — Z1211 Encounter for screening for malignant neoplasm of colon: Secondary | ICD-10-CM

## 2022-04-14 LAB — HEMOGLOBIN A1C
Hgb A1c MFr Bld: 5.6 % of total Hgb (ref <5.7)
Mean Plasma Glucose: 114 mg/dL
eAG (mmol/L): 6.3 mmol/L

## 2022-04-14 LAB — COMPLETE METABOLIC PANEL WITH GFR
AG Ratio: 1.6 (calc) (ref 1.0–2.5)
ALT: 24 U/L (ref 6–29)
AST: 19 U/L (ref 10–35)
Albumin: 4.9 g/dL (ref 3.6–5.1)
Alkaline phosphatase (APISO): 63 U/L (ref 31–125)
BUN: 11 mg/dL (ref 7–25)
CO2: 26 mmol/L (ref 20–32)
Calcium: 9.5 mg/dL (ref 8.6–10.2)
Chloride: 104 mmol/L (ref 98–110)
Creat: 0.76 mg/dL (ref 0.50–0.99)
Globulin: 3 g/dL (calc) (ref 1.9–3.7)
Glucose, Bld: 80 mg/dL (ref 65–99)
Potassium: 4.2 mmol/L (ref 3.5–5.3)
Sodium: 143 mmol/L (ref 135–146)
Total Bilirubin: 0.7 mg/dL (ref 0.2–1.2)
Total Protein: 7.9 g/dL (ref 6.1–8.1)
eGFR: 96 mL/min/{1.73_m2} (ref >=60)

## 2022-04-14 LAB — LIPID PANEL
Cholesterol: 166 mg/dL (ref <200)
HDL: 56 mg/dL (ref >=50)
LDL Cholesterol (Calc): 95 mg/dL (calc)
Non-HDL Cholesterol (Calc): 110 mg/dL (calc) (ref <130)
Total CHOL/HDL Ratio: 3 (calc) (ref <5.0)
Triglycerides: 67 mg/dL (ref <150)

## 2022-04-14 LAB — CBC WITH DIFFERENTIAL/PLATELET
Absolute Monocytes: 488 cells/uL (ref 200–950)
Basophils Absolute: 7 cells/uL (ref 0–200)
Basophils Relative: 0.1 %
Eosinophils Absolute: 67 cells/uL (ref 15–500)
Eosinophils Relative: 0.9 %
HCT: 46 % — ABNORMAL HIGH (ref 35.0–45.0)
Hemoglobin: 14.8 g/dL (ref 11.7–15.5)
Lymphs Abs: 1191 cells/uL (ref 850–3900)
MCH: 27.1 pg (ref 27.0–33.0)
MCHC: 32.2 g/dL (ref 32.0–36.0)
MCV: 84.1 fL (ref 80.0–100.0)
MPV: 11.4 fL (ref 7.5–12.5)
Monocytes Relative: 6.6 %
Neutro Abs: 5646 cells/uL (ref 1500–7800)
Neutrophils Relative %: 76.3 %
Platelets: 227 10*3/uL (ref 140–400)
RBC: 5.47 10*6/uL — ABNORMAL HIGH (ref 3.80–5.10)
RDW: 12.4 % (ref 11.0–15.0)
Total Lymphocyte: 16.1 %
WBC: 7.4 10*3/uL (ref 3.8–10.8)

## 2022-04-14 LAB — TSH: TSH: 0.97 mIU/L

## 2022-04-14 MED ORDER — NA SULFATE-K SULFATE-MG SULF 17.5-3.13-1.6 GM/177ML PO SOLN: 1.0000 | Freq: Once | ORAL | 0 refills | Status: AC

## 2022-04-14 NOTE — Telephone Encounter (Signed)
Gastroenterology Pre-Procedure Review  Request Date: 07/10/2022 Requesting Physician: Dr. Vicente Males  PATIENT REVIEW QUESTIONS: The patient responded to the following health history questions as indicated:    1. Are you having any GI issues? no 2. Do you have a personal history of Polyps? no 3. Do you have a family history of Colon Cancer or Polyps? yes (Aunt and cousins had polyps) 4. Diabetes Mellitus? no 5. Joint replacements in the past 12 months?no 6. Major health problems in the past 3 months?no 7. Any artificial heart valves, MVP, or defibrillator?no    MEDICATIONS & ALLERGIES:    Patient reports the following regarding taking any anticoagulation/antiplatelet therapy:   Plavix, Coumadin, Eliquis, Xarelto, Lovenox, Pradaxa, Brilinta, or Effient? no Aspirin? no  Patient confirms/reports the following medications:  Current Outpatient Medications  Medication Sig Dispense Refill   Multiple Vitamins-Minerals (MULTIVITAMIN WOMEN) TABS Take by mouth.     Turmeric (QC TUMERIC COMPLEX PO) Take by mouth. (Patient not taking: Reported on 04/13/2022)     Vitamin D, Cholecalciferol, 1000 UNITS TABS Take by mouth.     No current facility-administered medications for this visit.    Patient confirms/reports the following allergies:  No Known Allergies  No orders of the defined types were placed in this encounter.   AUTHORIZATION INFORMATION Primary Insurance: 1D#: Group #:  Secondary Insurance: 1D#: Group #:  SCHEDULE INFORMATION: Date: 07/10/2022 Time: Location: [facility]

## 2022-04-19 ENCOUNTER — Telehealth: Payer: Self-pay

## 2022-04-19 NOTE — Telephone Encounter (Signed)
Patient lvm requesting to reschedule her colonoscopy from 07/10/22 to 07/25/22.  Returned phone call, however voicemail is currently full.  Will send her a mychart message to let her know she has been rescheduled to 07/25/21.  Endoscopy dept notified. Patient called back.  She has been informed of date change.  Thanks,  [place], [place]

## 2022-05-12 DIAGNOSIS — Z23 Encounter for immunization: Secondary | ICD-10-CM | POA: Diagnosis not present

## 2022-06-22 DIAGNOSIS — Z23 Encounter for immunization: Secondary | ICD-10-CM | POA: Diagnosis not present

## 2022-07-24 ENCOUNTER — Encounter: Payer: Self-pay | Admitting: Gastroenterology

## 2022-07-25 ENCOUNTER — Ambulatory Visit
Admission: RE | Admit: 2022-07-25 | Discharge: 2022-07-25 | Disposition: A | Discharge status: Home / Self Care | Payer: BC Managed Care – PPO | Attending: Gastroenterology | Admitting: Gastroenterology

## 2022-07-25 ENCOUNTER — Ambulatory Visit: Payer: BC Managed Care – PPO | Admitting: Anesthesiology

## 2022-07-25 ENCOUNTER — Encounter: Payer: Self-pay | Admitting: Gastroenterology

## 2022-07-25 ENCOUNTER — Encounter
Admission: RE | Disposition: A | Discharge status: Home / Self Care | Payer: Self-pay | Source: Home / Self Care | Attending: Gastroenterology

## 2022-07-25 DIAGNOSIS — D122 Benign neoplasm of ascending colon: Secondary | ICD-10-CM

## 2022-07-25 DIAGNOSIS — Z1211 Encounter for screening for malignant neoplasm of colon: Secondary | ICD-10-CM | POA: Diagnosis not present

## 2022-07-25 DIAGNOSIS — K635 Polyp of colon: Secondary | ICD-10-CM | POA: Diagnosis not present

## 2022-07-25 DIAGNOSIS — D124 Benign neoplasm of descending colon: Secondary | ICD-10-CM | POA: Insufficient documentation

## 2022-07-25 HISTORY — PX: COLONOSCOPY WITH PROPOFOL: SHX5780

## 2022-07-25 SURGERY — COLONOSCOPY WITH PROPOFOL
Anesthesia: General

## 2022-07-25 MED ORDER — PROPOFOL 10 MG/ML IV BOLUS
INTRAVENOUS | Status: DC | PRN
  Administered 2022-07-25: 70 mg via INTRAVENOUS

## 2022-07-25 MED ORDER — SODIUM CHLORIDE 0.9 % IV SOLN: INTRAVENOUS | Status: DC

## 2022-07-25 MED ORDER — PROPOFOL 500 MG/50ML IV EMUL
INTRAVENOUS | Status: DC | PRN
  Administered 2022-07-25: 140 ug/kg/min via INTRAVENOUS

## 2022-07-25 NOTE — Anesthesia Postprocedure Evaluation (Signed)
Anesthesia Post Note  Patient: Abigail Massey  Procedure(s) Performed: COLONOSCOPY WITH PROPOFOL  Patient location during evaluation: Endoscopy Anesthesia Type: General Level of consciousness: awake and alert Pain management: pain level controlled Vital Signs Assessment: post-procedure vital signs reviewed and stable Respiratory status: spontaneous breathing, nonlabored ventilation, respiratory function stable and patient connected to nasal cannula oxygen Cardiovascular status: blood pressure returned to baseline and stable Postop Assessment: no apparent nausea or vomiting Anesthetic complications: no   No notable events documented.   Last Vitals:  Vitals:   07/25/22 0837 07/25/22 0845  BP: 116/86 112/67  Pulse: 97 87  Resp: 18 15  Temp:    SpO2: 100% 100%    Last Pain:  Vitals:   07/25/22 0845  TempSrc:   PainSc: 0-No pain                 Arita Miss

## 2022-07-25 NOTE — Anesthesia Preprocedure Evaluation (Signed)
Anesthesia Evaluation  Patient identified by MRN, date of birth, ID band Patient awake    Reviewed: Allergy & Precautions, NPO status , Patient's Chart, lab work & pertinent test results  History of Anesthesia Complications Negative for: history of anesthetic complications  Airway Mallampati: I  TM Distance: >3 FB Neck ROM: Full    Dental no notable dental hx. (+) Teeth Intact   Pulmonary neg pulmonary ROS, neg sleep apnea, neg COPD, Patient abstained from smoking.Not current smoker   Pulmonary exam normal breath sounds clear to auscultation       Cardiovascular Exercise Tolerance: Good METS(-) hypertension(-) CAD and (-) Past MI negative cardio ROS (-) dysrhythmias  Rhythm:Regular Rate:Normal - Systolic murmurs    Neuro/Psych negative neurological ROS  negative psych ROS   GI/Hepatic ,neg GERD  ,,(+)     (-) substance abuse    Endo/Other  neg diabetes    Renal/GU negative Renal ROS     Musculoskeletal   Abdominal   Peds  Hematology   Anesthesia Other Findings Past Medical History: No date: Fibroids No date: Heavy menstrual bleeding No date: Mitral prolapse  Reproductive/Obstetrics                             Anesthesia Physical Anesthesia Plan  ASA: 1  Anesthesia Plan: General   Post-op Pain Management: Minimal or no pain anticipated   Induction: Intravenous  PONV Risk Score and Plan: 3 and Propofol infusion, TIVA and Ondansetron  Airway Management Planned: Nasal Cannula  Additional Equipment: None  Intra-op Plan:   Post-operative Plan:   Informed Consent: I have reviewed the patients History and Physical, chart, labs and discussed the procedure including the risks, benefits and alternatives for the proposed anesthesia with the patient or authorized representative who has indicated his/her understanding and acceptance.     Dental advisory given  Plan Discussed  with: CRNA and Surgeon  Anesthesia Plan Comments: (Discussed risks of anesthesia with patient, including possibility of difficulty with spontaneous ventilation under anesthesia necessitating airway intervention, PONV, and rare risks such as cardiac or respiratory or neurological events, and allergic reactions. Discussed the role of CRNA in patient's perioperative care. Patient understands.)       Anesthesia Quick Evaluation

## 2022-07-25 NOTE — Op Note (Signed)
[facility] Patient Name: Abigail Massey Procedure Date: 07/25/2022 7:53 AM MRN: [id number] Account #: [id number] Date of Birth: [birth year] Admit Type: Outpatient Age: 52 Room: [facility] ENDO ROOM 2 Gender: Female Note Status: Finalized Instrument Name: Park Meo [id number] Procedure:             Colonoscopy Indications:           Screening for colorectal malignant neoplasm Providers:             Jonathon Bellows MD, MD Referring MD:          Jonathon Bellows MD, MD (Referring MD), Bethena Roys. Sowles,                         MD (Referring MD) Medicines:             Monitored Anesthesia Care Complications:         No immediate complications. Procedure:             Pre-Anesthesia Assessment:                        - Prior to the procedure, a History and Physical was                         performed, and patient medications, allergies and                         sensitivities were reviewed. The patient's tolerance                         of previous anesthesia was reviewed.                        - The risks and benefits of the procedure and the                         sedation options and risks were discussed with the                         patient. All questions were answered and informed                         consent was obtained.                        - ASA Grade Assessment: II - A patient with mild                         systemic disease.                        After obtaining informed consent, the colonoscope was                         passed under direct vision. Throughout the procedure,                         the patient's blood pressure, pulse, and oxygen  saturations were monitored continuously. The                         Colonoscope was introduced through the anus and                         advanced to the the cecum, identified by the                         appendiceal orifice. The colonoscopy was performed                          with ease. The patient tolerated the procedure well.                         The quality of the bowel preparation was excellent.                         The ileocecal valve, appendiceal orifice, and rectum                         were photographed. Findings:      The perianal and digital rectal examinations were normal.      A 6 mm polyp was found in the ascending colon. The polyp was sessile.       The polyp was removed with a cold snare. Resection and retrieval were       complete.      A 5 mm polyp was found in the descending colon. The polyp was sessile.       Biopsies were taken with a cold forceps for histology.      A 10 mm polyp was found in the descending colon. The polyp was       semi-pedunculated. The polyp was removed with a hot snare. Resection was       complete, but the polyp tissue was not retrieved.      A 7 mm polyp was found in the descending colon. The polyp was sessile.       The polyp was removed with a cold snare. Resection and retrieval were       complete.      The exam was otherwise without abnormality on direct and retroflexion       views. Impression:            - One 6 mm polyp in the ascending colon, removed with                         a cold snare. Resected and retrieved.                        - One 5 mm polyp in the descending colon. Biopsied.                        - One 10 mm polyp in the descending colon, removed                         with a hot snare. Complete resection. Polyp tissue not  retrieved.                        - One 7 mm polyp in the descending colon, removed with                         a cold snare. Resected and retrieved.                        - The examination was otherwise normal on direct and                         retroflexion views. Recommendation:        - Discharge patient to home (with escort).                        - Resume previous diet.                        - Continue present  medications.                        - Await pathology results.                        - Repeat colonoscopy in 3 years for surveillance. Procedure Code(s):     --- Professional ---                        [phone], Colonoscopy, flexible; with removal of                         tumor(s), polyp(s), or other lesion(s) by snare                         technique                        [id number], 72, Colonoscopy, flexible; with biopsy, single                         or multiple Diagnosis Code(s):     --- Professional ---                        Z12.11, Encounter for screening for malignant neoplasm                         of colon                        D12.2, Benign neoplasm of ascending colon                        D12.4, Benign neoplasm of descending colon CPT copyright 2022 American Medical Association. All rights reserved. The codes documented in this report are preliminary and upon coder review may  be revised to meet current compliance requirements. Jonathon Bellows, MD Jonathon Bellows MD, MD 07/25/2022 8:22:57 AM This report has been signed electronically. Number of Addenda: 0 Note Initiated On: 07/25/2022 7:53 AM Scope Withdrawal Time: 0 hours 11 minutes 3 seconds  Total Procedure Duration: 0 hours 19 minutes 41 seconds  Estimated Blood Loss:  Estimated blood loss: none.      [facility]

## 2022-07-25 NOTE — H&P (Signed)
Jonathon Bellows, MD [address] [place], [place], [place], [address] [address], [place], [place], [place], [address] Phone: [phone]  Fax: [phone]  Primary Care Physician:  Steele Sizer, MD   Pre-Procedure History & Physical: HPI:  Abigail Massey is a 52 y.o. female is here for an colonoscopy.   Past Medical History:  Diagnosis Date   Fibroids    Heavy menstrual bleeding    Mitral prolapse     Past Surgical History:  Procedure Laterality Date   ABDOMINAL HYSTERECTOMY  05/18/2015   COLONOSCOPY     LAPAROSCOPIC HYSTERECTOMY N/A 05/18/2015   Procedure: HYSTERECTOMY TOTAL LAPAROSCOPIC/BILATERAL SALPINGECTOMY;  Surgeon: Gae Dry, MD;  Location: [facility] ORS;  Service: Gynecology;  Laterality: N/A;   WISDOM TOOTH EXTRACTION Bilateral     Prior to Admission medications   Medication Sig Start Date End Date Taking? Authorizing Provider  Multiple Vitamins-Minerals (MULTIVITAMIN WOMEN) TABS Take by mouth.   Yes [provider]  Vitamin D, Cholecalciferol, 1000 UNITS TABS Take by mouth.   Yes [provider]  Turmeric (QC TUMERIC COMPLEX PO) Take by mouth. Patient not taking: Reported on 04/13/2022    [provider]    Allergies as of 04/14/2022   (No Known Allergies)    Family History  Problem Relation Age of Onset   Glaucoma Mother    Hypothyroidism Mother    Healthy Father    Healthy Sister    Prostate cancer Paternal Uncle        x 2   Breast cancer Cousin 44   Colon cancer Cousin 16   Kidney cancer Neg Hx    Bladder Cancer Neg Hx     Social History   Socioeconomic History   Marital status: Single    Spouse name: Not on file   Number of children: 0   Years of education: Not on file   Highest education level: Master's degree (e.g., MA, MS, MEng, MEd, MSW, MBA)  Occupational History   Occupation: Chief Financial Officer   Tobacco Use   Smoking status: Never   Smokeless tobacco: Never  Vaping Use   Vaping Use: Never  used  Substance and Sexual Activity   Alcohol use: No    Alcohol/week: 0.0 standard drinks of alcohol   Drug use: No   Sexual activity: Not Currently    Partners: Male  Other Topics Concern   Not on file  Social History Narrative   Never married, no children   Social Determinants of Health   Financial Resource Strain: Low Risk  (04/13/2022)   Overall Financial Resource Strain (CARDIA)    Difficulty of Paying Living Expenses: Not hard at all  Food Insecurity: No Food Insecurity (04/13/2022)   Hunger Vital Sign    Worried About Running Out of Food in the Last Year: Never true    [place] in the Last Year: Never true  Transportation Needs: No Transportation Needs (04/13/2022)   PRAPARE - Hydrologist (Medical): No    Lack of Transportation (Non-Medical): No  Physical Activity: Insufficiently Active (04/13/2022)   Exercise Vital Sign    Days of Exercise per Week: 2 days    Minutes of Exercise per Session: 20 min  Stress: No Stress Concern Present (04/13/2022)   [place]    Feeling of Stress : Only a little  Social Connections: Socially Isolated (04/13/2022)   Social Connection and Isolation  Panel [NHANES]    Frequency of Communication with Friends and Family: Once a week    Frequency of Social Gatherings with Friends and Family: Once a week    Attends Religious Services: Never    Marine scientist or Organizations: No    Attends Archivist Meetings: Never    Marital Status: Never married  Intimate Partner Violence: Not At Risk (04/13/2022)   Humiliation, Afraid, Rape, and Kick questionnaire    Fear of Current or Ex-Partner: No    Emotionally Abused: No    Physically Abused: No    Sexually Abused: No    Review of Systems: See HPI, otherwise negative ROS  Physical Exam: BP 114/70   Pulse 99   Temp 97.6 F (36.4 C) (Temporal)   Resp 17   Ht '5\' 10"'$   (1.778 m)   Wt 71.7 kg   LMP 05/03/2015 Comment: upreg 05/18/15  NEGATIVE  SpO2 100%   BMI 22.67 kg/m  General:   Alert,  pleasant and cooperative in NAD Head:  Normocephalic and atraumatic. Neck:  Supple; no masses or thyromegaly. Lungs:  Clear throughout to auscultation, normal respiratory effort.    Heart:  +S1, +S2, Regular rate and rhythm, No edema. Abdomen:  Soft, nontender and nondistended. Normal bowel sounds, without guarding, and without rebound.   Neurologic:  Alert and  oriented x4;  grossly normal neurologically.  Impression/Plan: Abigail Massey is here for an colonoscopy to be performed for Screening colonoscopy average risk   Risks, benefits, limitations, and alternatives regarding  colonoscopy have been reviewed with the patient.  Questions have been answered.  All parties agreeable.   Jonathon Bellows, MD  07/25/2022, 7:49 AM

## 2022-07-25 NOTE — Transfer of Care (Signed)
Immediate Anesthesia Transfer of Care Note  Patient: Abigail Massey  Procedure(s) Performed: COLONOSCOPY WITH PROPOFOL  Patient Location: PACU  Anesthesia Type:General  Level of Consciousness: awake, alert , and oriented  Airway & Oxygen Therapy: Patient Spontanous Breathing  Post-op Assessment: Report given to RN and Post -op Vital signs reviewed and stable  Post vital signs: Reviewed and stable  Last Vitals:  Vitals Value Taken Time  BP 105/72 07/25/22 0826  Temp 36.8 C 07/25/22 0825  Pulse 105 07/25/22 0826  Resp 24 07/25/22 0826  SpO2 100 % 07/25/22 0826  Vitals shown include unvalidated device data.  Last Pain:  Vitals:   07/25/22 0825  TempSrc: Temporal  PainSc: 0-No pain         Complications: No notable events documented.

## 2022-07-26 ENCOUNTER — Encounter: Payer: Self-pay | Admitting: Gastroenterology

## 2022-07-26 LAB — SURGICAL PATHOLOGY

## 2022-08-25 DIAGNOSIS — Z23 Encounter for immunization: Secondary | ICD-10-CM | POA: Diagnosis not present

## 2022-12-26 ENCOUNTER — Other Ambulatory Visit: Payer: Self-pay | Admitting: Family Medicine

## 2022-12-26 DIAGNOSIS — Z1231 Encounter for screening mammogram for malignant neoplasm of breast: Secondary | ICD-10-CM

## 2023-01-23 ENCOUNTER — Ambulatory Visit
Admission: RE | Admit: 2023-01-23 | Discharge: 2023-01-23 | Disposition: A | Discharge status: Home / Self Care | Payer: BC Managed Care – PPO | Source: Ambulatory Visit | Attending: Family Medicine | Admitting: Family Medicine

## 2023-01-23 DIAGNOSIS — Z1231 Encounter for screening mammogram for malignant neoplasm of breast: Secondary | ICD-10-CM | POA: Insufficient documentation

## 2023-04-16 NOTE — Progress Notes (Unsigned)
Name: Abigail Massey   MRN: [id number]    DOB: [birth year]   Date:04/17/2023       Progress Note  Subjective  Chief Complaint  Annual Exam  HPI  Patient presents for annual CPE.  Diet: balanced , cooks at home most of the time Exercise:  she will increase to  minutes per week  Last Eye Exam: up to date Last Dental Exam: up to date  [employer] Visit from 04/17/2023 in [place] [facility]  AUDIT-C Score 1      Depression: Phq 9 is  negative    04/17/2023    8:04 AM 04/13/2022    8:30 AM 01/11/2021    1:03 PM 08/05/2019    9:06 AM 05/16/2017    8:47 AM  Depression screen PHQ 2/9  Decreased Interest 0 0 0 0 0  Down, Depressed, Hopeless 0 0 0 0 0  PHQ - 2 Score 0 0 0 0 0  Altered sleeping 0 0  0   Tired, decreased energy 0 0  0   Change in appetite 0 0  0   Feeling bad or failure about yourself  0 0  0   Trouble concentrating 0 0  0   Moving slowly or fidgety/restless 0 0  0   Suicidal thoughts 0 0  0   PHQ-9 Score 0 0  0    Hypertension: BP Readings from Last 3 Encounters:  04/17/23 118/70  07/25/22 112/67  04/13/22 118/70   Obesity: Wt Readings from Last 3 Encounters:  04/17/23 160 lb (72.6 kg)  07/25/22 158 lb (71.7 kg)  04/13/22 161 lb 12.8 oz (73.4 kg)   BMI Readings from Last 3 Encounters:  04/17/23 23.29 kg/m  07/25/22 22.67 kg/m  04/13/22 25.34 kg/m     Vaccines:    Tdap: up to date Shingrix: we will get records  Pneumonia: N/A Flu: 2021, due COVID-19: up to date - we will get records    Hep C Screening: 08/05/19 STD testing and prevention (HIV/chl/gon/syphilis): 08/05/19  Intimate partner violence: negative screen  Sexual History : not sexually active in a long time Menstrual History/LMP/Abnormal Bleeding: hysterectomy in 2016 still has ovaries Discussed importance of follow up if any post-menopausal bleeding: N/A Incontinence Symptoms: negative for symptoms   Breast cancer:  - Last Mammogram: 01/23/23 -  BRCA gene screening: N/A  Osteoporosis Prevention : Discussed high calcium and vitamin D supplementation, weight bearing exercises Bone density: she will check coverage with insurance   Cervical cancer screening: N/A  Skin cancer: Discussed monitoring for atypical lesions  Colorectal cancer: 07/25/22  repeat in 3 years  Lung cancer:  Low Dose CT Chest recommended if Age 52-80 years, 20 pack-year currently smoking OR have quit w/in 15years. Patient does not qualify for screen   ECG: 11/21/17  Advanced Care Planning: A voluntary discussion about advance care planning including the explanation and discussion of advance directives.  Discussed health care proxy and Living will, and the patient was able to identify a health care proxy as mother .  Patient does not have a living will and power of attorney of health care   Lipids: Lab Results  Component Value Date   CHOL 166 04/13/2022   CHOL 150 01/11/2021   CHOL 154 08/05/2019   Lab Results  Component Value Date   HDL 56 04/13/2022   HDL 44 (L) 01/11/2021   HDL 48 (L) 08/05/2019   Lab Results  Component Value Date  LDLCALC 95 04/13/2022   LDLCALC 89 01/11/2021   LDLCALC 91 08/05/2019   Lab Results  Component Value Date   TRIG 67 04/13/2022   TRIG 82 01/11/2021   TRIG 68 08/05/2019   Lab Results  Component Value Date   CHOLHDL 3.0 04/13/2022   CHOLHDL 3.4 01/11/2021   CHOLHDL 3.2 08/05/2019   No results found for: "LDLDIRECT"  Glucose: Glucose, Bld  Date Value Ref Range Status  04/13/2022 80 65 - 99 mg/dL Final    Comment:    .            Fasting reference interval .   01/11/2021 83 65 - 139 mg/dL Final    Comment:    .        Non-fasting reference interval .   08/05/2019 91 65 - 99 mg/dL Final    Comment:    .            Fasting reference interval .     Patient Active Problem List   Diagnosis Date Noted   Screening for colon cancer 07/25/2022   Adenomatous polyp of ascending colon 07/25/2022    History of nephrolithiasis 07/22/2016   S/P hysterectomy 05/18/2015   Eyelid cyst 04/26/2015    Past Surgical History:  Procedure Laterality Date   ABDOMINAL HYSTERECTOMY  05/18/2015   COLONOSCOPY     COLONOSCOPY WITH PROPOFOL N/A 07/25/2022   Procedure: COLONOSCOPY WITH PROPOFOL;  Surgeon: Wyline Mood, MD;  Location[facility] ENDOSCOPY;  Service: Gastroenterology;  Laterality: N/A;   LAPAROSCOPIC HYSTERECTOMY N/A 05/18/2015   Procedure: HYSTERECTOMY TOTAL LAPAROSCOPIC/BILATERAL SALPINGECTOMY;  Surgeon: Nadara Mustard, MD;  Location: [facility] ORS;  Service: Gynecology;  Laterality: N/A;   WISDOM TOOTH EXTRACTION Bilateral     Family History  Problem Relation Age of Onset   Glaucoma Mother    Hypothyroidism Mother    Healthy Father    Healthy Sister    Prostate cancer Paternal Uncle        x 2   Breast cancer Cousin 42   Colon cancer Cousin 45   Kidney cancer Neg Hx    Bladder Cancer Neg Hx     Social History   Socioeconomic History   Marital status: Single    Spouse name: Not on file   Number of children: 0   Years of education: Not on file   Highest education level: Master's degree (e.g., MA, MS, MEng, MEd, MSW, MBA)  Occupational History   Occupation: Art gallery manager   Tobacco Use   Smoking status: Never   Smokeless tobacco: Never  Vaping Use   Vaping status: Never Used  Substance and Sexual Activity   Alcohol use: No    Alcohol/week: 0.0 standard drinks of alcohol   Drug use: No   Sexual activity: Not Currently    Partners: Male  Other Topics Concern   Not on file  Social History Narrative   Never married, no children   Social Determinants of Health   Financial Resource Strain: Low Risk  (04/17/2023)   Overall Financial Resource Strain (CARDIA)    Difficulty of Paying Living Expenses: Not hard at all  Food Insecurity: No Food Insecurity (04/17/2023)   Hunger Vital Sign    Worried About Running Out of Food in the Last Year: Never true    Ran Out of Food in the Last  Year: Never true  Transportation Needs: No Transportation Needs (04/17/2023)   PRAPARE - Administrator, Civil Service (Medical): No  Lack of Transportation (Non-Medical): No  Physical Activity: Insufficiently Active (04/17/2023)   Exercise Vital Sign    Days of Exercise per Week: 2 days    Minutes of Exercise per Session: 20 min  Stress: No Stress Concern Present (04/17/2023)   [employer] of Occupational Health - Occupational Stress Questionnaire    Feeling of Stress : Only a little  Social Connections: Socially Isolated (04/17/2023)   Social Connection and Isolation Panel [NHANES]    Frequency of Communication with Friends and Family: Once a week    Frequency of Social Gatherings with Friends and Family: Once a week    Attends Religious Services: Never    Database administrator or Organizations: No    Attends Banker Meetings: Never    Marital Status: Never married  Intimate Partner Violence: Not At Risk (04/17/2023)   Humiliation, Afraid, Rape, and Kick questionnaire    Fear of Current or Ex-Partner: No    Emotionally Abused: No    Physically Abused: No    Sexually Abused: No     Current Outpatient Medications:    Multiple Vitamins-Minerals (MULTIVITAMIN WOMEN) TABS, Take by mouth., Disp: , Rfl:    Vitamin D, Cholecalciferol, 1000 UNITS TABS, Take by mouth., Disp: , Rfl:    Turmeric (QC TUMERIC COMPLEX PO), Take by mouth. (Patient not taking: Reported on 04/13/2022), Disp: , Rfl:   No Known Allergies   ROS  Constitutional: Negative for fever or weight change.  Respiratory: Negative for cough and shortness of breath.   Cardiovascular: Negative for chest pain or palpitations.  Gastrointestinal: Negative for abdominal pain, no bowel changes.  Musculoskeletal: Negative for gait problem or joint swelling.  Skin: Negative for rash.  Neurological: Negative for dizziness or headache.  No other specific complaints in a complete review of  systems (except as listed in HPI above).   Objective  Vitals:   04/17/23 0802  BP: 118/70  Pulse: 81  Resp: 14  Temp: 97.8 F (36.6 C)  TempSrc: Oral  SpO2: 99%  Weight: 160 lb (72.6 kg)  Height: 5' 9.5" (1.765 m)    Body mass index is 23.29 kg/m.  Physical Exam  Constitutional: Patient appears well-developed and well-nourished. No distress.  HENT: Head: Normocephalic and atraumatic. Ears: B TMs ok, no erythema or effusion; Nose: Nose normal. Mouth/Throat: Oropharynx is clear and moist. No oropharyngeal exudate.  Eyes: Conjunctivae and EOM are normal. Pupils are equal, round, and reactive to light. No scleral icterus.  Neck: Normal range of motion. Neck supple. No JVD present. No thyromegaly present.  Cardiovascular: Normal rate, regular rhythm and normal heart sounds.  No murmur heard. No BLE edema. Pulmonary/Chest: Effort normal and breath sounds normal. No respiratory distress. Abdominal: Soft. Bowel sounds are normal, no distension. There is no tenderness. no masses Breast: lumpy , no nipple discharge or rashes FEMALE GENITALIA:  Not done  RECTAL: not done  Musculoskeletal: Normal range of motion, no joint effusions. No gross deformities Neurological: he is alert and oriented to person, place, and time. No cranial nerve deficit. Coordination, balance, strength, speech and gait are normal.  Skin: Skin is warm and dry. No rash noted. No erythema. Dark spot on upper right back discussed biopsy - she will think about it Psychiatric: Patient has a normal mood and affect. behavior is normal. Judgment and thought content normal.   Fall Risk:    04/17/2023    8:04 AM 04/13/2022    8:15 AM 01/11/2021    1:03 PM  08/05/2019    8:55 AM 11/21/2017    3:17 PM  Fall Risk   Falls in the past year? 0 0 0 0 No  Number falls in past yr:   0 0   Injury with Fall?   0 0   Risk for fall due to : No Fall Risks No Fall Risks No Fall Risks    Follow up Falls prevention discussed;Education  provided;Falls evaluation completed Falls prevention discussed;Education provided;Falls evaluation completed Falls prevention discussed       Functional Status Survey: Is the patient deaf or have difficulty hearing?: No Does the patient have difficulty seeing, even when wearing glasses/contacts?: No Does the patient have difficulty concentrating, remembering, or making decisions?: No Does the patient have difficulty walking or climbing stairs?: No Does the patient have difficulty dressing or bathing?: No Does the patient have difficulty doing errands alone such as visiting a doctor's office or shopping?: No   Assessment & Plan  1. Well adult exam  - DG Bone Density; Future - MM 3D SCREENING MAMMOGRAM BILATERAL BREAST; Future - Urinalysis, Complete - Lipid panel - COMPLETE METABOLIC PANEL WITH GFR - CBC with Differential/Platelet - VITAMIN D 25 Hydroxy (Vit-D Deficiency, Fractures) - TSH  2. Ovarian failure  - DG Bone Density; Future  3. Breast cancer screening by mammogram  - MM 3D SCREENING MAMMOGRAM BILATERAL BREAST; Future  4. Osteoporosis screening  - DG Bone Density; Future  5. History of anemia  - CBC with Differential/Platelet  6. Diabetes mellitus screening  - Hemoglobin A1c  7. Lipid screening  - Lipid panel  8. Family history of thyroid disease  - TSH  9. Vitamin D deficiency  - VITAMIN D 25 Hydroxy (Vit-D Deficiency, Fractures)   10. Skin lesion of back  Likely SK but discussed returning for shave biopsy since it is on her back and she think it is growing    -USPSTF grade A and B recommendations reviewed with patient; age-appropriate recommendations, preventive care, screening tests, etc discussed and encouraged; healthy living encouraged; see AVS for patient education given to patient -Discussed importance of 150 minutes of physical activity weekly, eat two servings of fish weekly, eat one serving of tree nuts ( cashews, pistachios, pecans,  almonds.Marland Kitchen) every other day, eat 6 servings of fruit/vegetables daily and drink plenty of water and avoid sweet beverages.   -Reviewed Health Maintenance: Yes.

## 2023-04-17 ENCOUNTER — Ambulatory Visit (INDEPENDENT_AMBULATORY_CARE_PROVIDER_SITE_OTHER): Payer: BC Managed Care – PPO | Admitting: Family Medicine

## 2023-04-17 ENCOUNTER — Encounter: Payer: Self-pay | Admitting: Family Medicine

## 2023-04-17 VITALS — BP 118/70 | HR 81 | Temp 97.8°F | Resp 14 | Ht 69.5 in | Wt 160.0 lb

## 2023-04-17 DIAGNOSIS — E559 Vitamin D deficiency, unspecified: Secondary | ICD-10-CM | POA: Diagnosis not present

## 2023-04-17 DIAGNOSIS — Z862 Personal history of diseases of the blood and blood-forming organs and certain disorders involving the immune mechanism: Secondary | ICD-10-CM

## 2023-04-17 DIAGNOSIS — Z131 Encounter for screening for diabetes mellitus: Secondary | ICD-10-CM | POA: Diagnosis not present

## 2023-04-17 DIAGNOSIS — L989 Disorder of the skin and subcutaneous tissue, unspecified: Secondary | ICD-10-CM

## 2023-04-17 DIAGNOSIS — Z8349 Family history of other endocrine, nutritional and metabolic diseases: Secondary | ICD-10-CM | POA: Diagnosis not present

## 2023-04-17 DIAGNOSIS — Z13820 Encounter for screening for osteoporosis: Secondary | ICD-10-CM

## 2023-04-17 DIAGNOSIS — Z0000 Encounter for general adult medical examination without abnormal findings: Secondary | ICD-10-CM

## 2023-04-17 DIAGNOSIS — E2839 Other primary ovarian failure: Secondary | ICD-10-CM | POA: Diagnosis not present

## 2023-04-17 DIAGNOSIS — Z1231 Encounter for screening mammogram for malignant neoplasm of breast: Secondary | ICD-10-CM | POA: Diagnosis not present

## 2023-04-17 DIAGNOSIS — Z13220 Encounter for screening for lipoid disorders: Secondary | ICD-10-CM

## 2023-04-17 NOTE — Patient Instructions (Signed)
For bone density tell insurance codes were ovarian failure, osteoporosis screen

## 2023-04-18 LAB — URINALYSIS, COMPLETE
Bacteria, UA: NONE SEEN /[HPF]
Bilirubin Urine: NEGATIVE
Glucose, UA: NEGATIVE
Hgb urine dipstick: NEGATIVE
Hyaline Cast: NONE SEEN /[LPF]
Ketones, ur: NEGATIVE
Leukocytes,Ua: NEGATIVE
Nitrite: NEGATIVE
Protein, ur: NEGATIVE
RBC / HPF: NONE SEEN /[HPF] (ref 0–2)
Specific Gravity, Urine: 1.012 (ref 1.001–1.035)
Squamous Epithelial / HPF: NONE SEEN /[HPF] (ref <=5)
WBC, UA: NONE SEEN /[HPF] (ref 0–5)
pH: 5.5 (ref 5.0–8.0)

## 2023-04-18 LAB — CBC WITH DIFFERENTIAL/PLATELET
Absolute Lymphocytes: 861 {cells}/uL (ref 850–3900)
Absolute Monocytes: 332 {cells}/uL (ref 200–950)
Basophils Absolute: 8 {cells}/uL (ref 0–200)
Basophils Relative: 0.2 %
Eosinophils Absolute: 90 {cells}/uL (ref 15–500)
Eosinophils Relative: 2.2 %
HCT: 44.8 % (ref 35.0–45.0)
Hemoglobin: 14.1 g/dL (ref 11.7–15.5)
MCH: 27 pg (ref 27.0–33.0)
MCHC: 31.5 g/dL — ABNORMAL LOW (ref 32.0–36.0)
MCV: 85.7 fL (ref 80.0–100.0)
MPV: 11.1 fL (ref 7.5–12.5)
Monocytes Relative: 8.1 %
Neutro Abs: 2809 {cells}/uL (ref 1500–7800)
Neutrophils Relative %: 68.5 %
Platelets: 208 10*3/uL (ref 140–400)
RBC: 5.23 10*6/uL — ABNORMAL HIGH (ref 3.80–5.10)
RDW: 12.6 % (ref 11.0–15.0)
Total Lymphocyte: 21 %
WBC: 4.1 10*3/uL (ref 3.8–10.8)

## 2023-04-18 LAB — COMPLETE METABOLIC PANEL WITH GFR
AG Ratio: 1.7 (calc) (ref 1.0–2.5)
ALT: 19 U/L (ref 6–29)
AST: 19 U/L (ref 10–35)
Albumin: 4.8 g/dL (ref 3.6–5.1)
Alkaline phosphatase (APISO): 56 U/L (ref 37–153)
BUN: 12 mg/dL (ref 7–25)
CO2: 28 mmol/L (ref 20–32)
Calcium: 9.8 mg/dL (ref 8.6–10.4)
Chloride: 104 mmol/L (ref 98–110)
Creat: 0.81 mg/dL (ref 0.50–1.03)
Globulin: 2.9 g/dL (ref 1.9–3.7)
Glucose, Bld: 88 mg/dL (ref 65–99)
Potassium: 4.7 mmol/L (ref 3.5–5.3)
Sodium: 140 mmol/L (ref 135–146)
Total Bilirubin: 0.6 mg/dL (ref 0.2–1.2)
Total Protein: 7.7 g/dL (ref 6.1–8.1)
eGFR: 88 mL/min/{1.73_m2} (ref >=60)

## 2023-04-18 LAB — HEMOGLOBIN A1C
Hgb A1c MFr Bld: 5.4 %{Hb} (ref <5.7)
Mean Plasma Glucose: 108 mg/dL
eAG (mmol/L): 6 mmol/L

## 2023-04-18 LAB — VITAMIN D 25 HYDROXY (VIT D DEFICIENCY, FRACTURES): Vit D, 25-Hydroxy: 28 ng/mL — ABNORMAL LOW (ref 30–100)

## 2023-04-18 LAB — TSH: TSH: 0.95 m[IU]/L

## 2023-04-18 LAB — LIPID PANEL
Cholesterol: 172 mg/dL (ref <200)
HDL: 54 mg/dL (ref >=50)
LDL Cholesterol (Calc): 104 mg/dL — ABNORMAL HIGH
Non-HDL Cholesterol (Calc): 118 mg/dL (ref <130)
Total CHOL/HDL Ratio: 3.2 (calc) (ref <5.0)
Triglycerides: 56 mg/dL (ref <150)

## 2023-08-03 ENCOUNTER — Encounter: Payer: Self-pay | Admitting: Family Medicine

## 2023-08-03 ENCOUNTER — Ambulatory Visit (INDEPENDENT_AMBULATORY_CARE_PROVIDER_SITE_OTHER): Payer: BC Managed Care – PPO | Admitting: Family Medicine

## 2023-08-03 VITALS — BP 118/68 | HR 96 | Resp 16 | Ht 69.5 in | Wt 163.2 lb

## 2023-08-03 DIAGNOSIS — I341 Nonrheumatic mitral (valve) prolapse: Secondary | ICD-10-CM

## 2023-08-03 DIAGNOSIS — R002 Palpitations: Secondary | ICD-10-CM

## 2023-08-03 NOTE — Progress Notes (Signed)
Name: Abigail Massey   MRN: [id number]    DOB: 26-Jul-1970   Date:08/03/2023       Progress Note  Subjective  Chief Complaint  Chief Complaint  Patient presents with   Referral    Cardiology- due to HR being above tan expected while sleeping. Pt watch advises her and has noticed heart racing in the mornings when she wakes up.   HPI   Patient came in today because she noticed that her heart rate on her watch ( she got the watch in Dec 2023 ) started to pick up a faster than usual heart rate while sleeping over the past two weeks, her range is 55-99. She denies sob or chest pain, but has intermittent palpitation. She had a stress echo done 01/2010 that showed Mitral valve prolapse and mild regurgitation. She is also concerned because left arm that felt different than right side twice in the past two weeks. Not sure if associated with fast heart rate, no tingling but feels numb at times, but difficulty describing it    Patient Active Problem List   Diagnosis Date Noted   Screening for colon cancer 07/25/2022   Adenomatous polyp of ascending colon 07/25/2022   History of nephrolithiasis 07/22/2016   S/P hysterectomy 05/18/2015   Eyelid cyst 04/26/2015    Past Surgical History:  Procedure Laterality Date   ABDOMINAL HYSTERECTOMY  05/18/2015   COLONOSCOPY     COLONOSCOPY WITH PROPOFOL N/A 07/25/2022   Procedure: COLONOSCOPY WITH PROPOFOL;  Surgeon: Wyline Mood, MD;  Location: [facility] ENDOSCOPY;  Service: Gastroenterology;  Laterality: N/A;   LAPAROSCOPIC HYSTERECTOMY N/A 05/18/2015   Procedure: HYSTERECTOMY TOTAL LAPAROSCOPIC/BILATERAL SALPINGECTOMY;  Surgeon: Nadara Mustard, MD;  Location: [facility] ORS;  Service: Gynecology;  Laterality: N/A;   WISDOM TOOTH EXTRACTION Bilateral     Family History  Problem Relation Age of Onset   Glaucoma Mother    Hypothyroidism Mother    Healthy Father    Healthy Sister    Prostate cancer Paternal Uncle        x 2   Breast cancer Cousin 85   Colon  cancer Cousin 45   Kidney cancer Neg Hx    Bladder Cancer Neg Hx     Social History   Tobacco Use   Smoking status: Never   Smokeless tobacco: Never  Substance Use Topics   Alcohol use: No    Alcohol/week: 0.0 standard drinks of alcohol     Current Outpatient Medications:    Multiple Vitamins-Minerals (MULTIVITAMIN WOMEN) TABS, Take by mouth. (Patient not taking: Reported on 08/03/2023), Disp: , Rfl:    Turmeric (QC TUMERIC COMPLEX PO), Take by mouth., Disp: , Rfl:    Vitamin D, Cholecalciferol, 1000 UNITS TABS, Take by mouth. (Patient not taking: Reported on 08/03/2023), Disp: , Rfl:   No Known Allergies  I personally reviewed active problem list, medication list, allergies, family history with the patient/caregiver today.   ROS  Ten systems reviewed and is negative except as mentioned in HPI    Objective  Vitals:   08/03/23 1103  BP: 118/68  Pulse: 96  Resp: 16  SpO2: 99%  Weight: 163 lb 3.2 oz (74 kg)  Height: 5' 9.5" (1.765 m)    Body mass index is 23.75 kg/m.  Physical Exam  Constitutional: Patient appears well-developed and well-nourished. Obese  No distress.  HEENT: head atraumatic, normocephalic, pupils equal and reactive to light,, neck supple, throat within normal limits Cardiovascular: Normal rate, regular rhythm and normal  heart sounds.  No murmur heard. No BLE edema. Pulmonary/Chest: Effort normal and breath sounds normal. No respiratory distress. Abdominal: Soft.  There is no tenderness. Psychiatric: Patient has a normal mood and affect. behavior is normal. Judgment and thought content normal.   Diabetic Foot Exam:     PHQ2/9:    08/03/2023   10:56 AM 04/17/2023    8:04 AM 04/13/2022    8:30 AM 01/11/2021    1:03 PM 08/05/2019    9:06 AM  Depression screen PHQ 2/9  Decreased Interest 0 0 0 0 0  Down, Depressed, Hopeless 0 0 0 0 0  PHQ - 2 Score 0 0 0 0 0  Altered sleeping 0 0 0  0  Tired, decreased energy 0 0 0  0  Change in appetite 0 0  0  0  Feeling bad or failure about yourself  0 0 0  0  Trouble concentrating 0 0 0  0  Moving slowly or fidgety/restless 0 0 0  0  Suicidal thoughts 0 0 0  0  PHQ-9 Score 0 0 0  0  Difficult doing work/chores Not difficult at all        phq 9 is negative  Fall Risk:    08/03/2023   10:56 AM 04/17/2023    8:04 AM 04/13/2022    8:15 AM 01/11/2021    1:03 PM 08/05/2019    8:55 AM  Fall Risk   Falls in the past year? 0 0 0 0 0  Number falls in past yr: 0   0 0  Injury with Fall? 0   0 0  Risk for fall due to : No Fall Risks No Fall Risks No Fall Risks No Fall Risks   Follow up Falls prevention discussed;Education provided;Falls evaluation completed Falls prevention discussed;Education provided;Falls evaluation completed Falls prevention discussed;Education provided;Falls evaluation completed Falls prevention discussed      Assessment & Plan  1. Palpitation (Primary)  - Ambulatory referral to Cardiology  2. Mitral valve prolapse  - Ambulatory referral to Cardiology

## 2023-09-26 ENCOUNTER — Ambulatory Visit

## 2023-09-26 ENCOUNTER — Ambulatory Visit: Payer: BC Managed Care – PPO | Attending: Cardiology | Admitting: Cardiology

## 2023-09-26 VITALS — BP 110/70 | HR 76 | Ht 70.0 in | Wt 163.0 lb

## 2023-09-26 DIAGNOSIS — R002 Palpitations: Secondary | ICD-10-CM | POA: Diagnosis not present

## 2023-09-26 DIAGNOSIS — I340 Nonrheumatic mitral (valve) insufficiency: Secondary | ICD-10-CM | POA: Diagnosis not present

## 2023-09-26 NOTE — Progress Notes (Signed)
 Cardiology Office Note:    Date:  09/26/2023   ID:  Abigail Massey, DOB 1970-07-23, MRN [id number]  PCP:  Abigail Cory, MD   [facility] Providers Cardiologist:  None     Referring MD: Abigail Cory, MD   No chief complaint on file.  Abigail Massey is a 53 y.o. female who is being seen today for the evaluation of palpitations at the request of Abigail Cory, MD.   History of Present Illness:    Abigail Massey is a 53 y.o. female with a hx of mitral valve prolapse, presenting with palpitations.  States having occasional palpitations lasting a few minutes over the past several months.  Last episode was yesterday.  Symptoms typically occur once a week.  Had an echo back in 2011 showing normal ejection fraction, posterior mitral valve prolapse, mild mitral regurgitation.  Denies chest pain, edema, personal or immediate family history of heart disease.  Past Medical History:  Diagnosis Date   Fibroids    Heavy menstrual bleeding    Mitral prolapse     Past Surgical History:  Procedure Laterality Date   ABDOMINAL HYSTERECTOMY  05/18/2015   COLONOSCOPY     COLONOSCOPY WITH PROPOFOL N/A 07/25/2022   Procedure: COLONOSCOPY WITH PROPOFOL;  Surgeon: Wyline Mood, MD;  Location: [facility] ENDOSCOPY;  Service: Gastroenterology;  Laterality: N/A;   LAPAROSCOPIC HYSTERECTOMY N/A 05/18/2015   Procedure: HYSTERECTOMY TOTAL LAPAROSCOPIC/BILATERAL SALPINGECTOMY;  Surgeon: Nadara Mustard, MD;  Location: [facility] ORS;  Service: Gynecology;  Laterality: N/A;   WISDOM TOOTH EXTRACTION Bilateral     Current Medications: Current Meds  Medication Sig   Multiple Vitamins-Minerals (MULTIVITAMIN WOMEN) TABS Take by mouth.   Vitamin D, Cholecalciferol, 1000 UNITS TABS Take by mouth.     Allergies:   Patient has no known allergies.   Social History   Socioeconomic History   Marital status: Single    Spouse name: Not on file   Number of children: 0   Years of education: Not  on file   Highest education level: Master's degree (e.g., MA, MS, MEng, MEd, MSW, MBA)  Occupational History   Occupation: Art gallery manager   Tobacco Use   Smoking status: Never   Smokeless tobacco: Never  Vaping Use   Vaping status: Never Used  Substance and Sexual Activity   Alcohol use: No    Alcohol/week: 0.0 standard drinks of alcohol   Drug use: No   Sexual activity: Not Currently    Partners: Male  Other Topics Concern   Not on file  Social History Narrative   Never married, no children   Social Drivers of Corporate investment banker Strain: Low Risk  (04/17/2023)   Overall Financial Resource Strain (CARDIA)    Difficulty of Paying Living Expenses: Not hard at all  Food Insecurity: No Food Insecurity (04/17/2023)   Hunger Vital Sign    Worried About Running Out of Food in the Last Year: Never true    Ran Out of Food in the Last Year: Never true  Transportation Needs: No Transportation Needs (04/17/2023)   PRAPARE - Administrator, Civil Service (Medical): No    Lack of Transportation (Non-Medical): No  Physical Activity: Insufficiently Active (04/17/2023)   Exercise Vital Sign    Days of Exercise per Week: 2 days    Minutes of Exercise per Session: 20 min  Stress: No Stress Concern Present (04/17/2023)   [employer] of Occupational Health - Occupational Stress Questionnaire  Feeling of Stress : Only a little  Social Connections: Socially Isolated (04/17/2023)   Social Connection and Isolation Panel [NHANES]    Frequency of Communication with Friends and Family: Once a week    Frequency of Social Gatherings with Friends and Family: Once a week    Attends Religious Services: Never    Database administrator or Organizations: No    Attends Engineer, structural: Never    Marital Status: Never married     Family History: The patient's family history includes Breast cancer (age of onset: 70) in her cousin; Colon cancer (age of onset: 84) in her  cousin; Glaucoma in her mother; Healthy in her father and sister; Hypothyroidism in her mother; Prostate cancer in her paternal uncle. There is no history of Kidney cancer or Bladder Cancer.  ROS:   Please see the history of present illness.     All other systems reviewed and are negative.  EKGs/Labs/Other Studies Reviewed:    The following studies were reviewed today:  EKG Interpretation Date/Time:  Wednesday September 26 2023 09:59:58 EDT Ventricular Rate:  76 PR Interval:  188 QRS Duration:  82 QT Interval:  370 QTC Calculation: 416 R Axis:   83  Text Interpretation: Normal sinus rhythm Biatrial enlargement Confirmed by Debbe Odea ([id number]) on 09/26/2023 10:18:19 AM    Recent Labs: 04/17/2023: ALT 19; BUN 12; Creat 0.81; Hemoglobin 14.1; Platelets 208; Potassium 4.7; Sodium 140; TSH 0.95  Recent Lipid Panel    Component Value Date/Time   CHOL 172 04/17/2023 0856   CHOL 138 04/29/2015 0949   TRIG 56 04/17/2023 0856   HDL 54 04/17/2023 0856   HDL 47 04/29/2015 0949   CHOLHDL 3.2 04/17/2023 0856   VLDL 12 05/09/2016 0808   LDLCALC 104 (H) 04/17/2023 0856     Risk Assessment/Calculations:             Physical Exam:    VS:  BP 110/70 (BP Location: Left Arm)   Pulse 76   Ht 5\' 10"  (1.778 m)   Wt 163 lb (73.9 kg)   LMP 05/03/2015 Comment: upreg 05/18/15  NEGATIVE  SpO2 99%   BMI 23.39 kg/m     Wt Readings from Last 3 Encounters:  09/26/23 163 lb (73.9 kg)  08/03/23 163 lb 3.2 oz (74 kg)  04/17/23 160 lb (72.6 kg)     GEN:  Well nourished, well developed in no acute distress HEENT: Normal NECK: No JVD; No carotid bruits CARDIAC: RRR, no murmurs, rubs, gallops RESPIRATORY:  Clear to auscultation without rales, wheezing or rhonchi  ABDOMEN: Soft, non-tender, non-distended MUSCULOSKELETAL:  No edema; No deformity  SKIN: Warm and dry NEUROLOGIC:  Alert and oriented x 3 PSYCHIATRIC:  Normal affect   ASSESSMENT:    1. Palpitations   2. Mitral valve  insufficiency, unspecified etiology    PLAN:    In order of problems listed above:  Palpitations, please cardiac monitor to evaluate any significant arrhythmias. History of posterior mitral leaflet prolapse, mild MR.  Obtain echo to evaluate any worsening regurgitation.  Follow-up after cardiac testing      Medication Adjustments/Labs and Tests Ordered: Current medicines are reviewed at length with the patient today.  Concerns regarding medicines are outlined above.  Orders Placed This Encounter  Procedures   LONG TERM MONITOR (3-14 DAYS)   EKG 12-Lead   ECHOCARDIOGRAM COMPLETE   No orders of the defined types were placed in this encounter.   Patient Instructions  Medication Instructions:  Your Physician recommend you continue on your current medication as directed.     *If you need a refill on your cardiac medications before your next appointment, please call your pharmacy*   Lab Work: None ordered If you have labs (blood work) drawn today and your tests are completely normal, you will receive your results only by: MyChart Message (if you have MyChart) OR A paper copy in the mail If you have any lab test that is abnormal or we need to change your treatment, we will call you to review the results.   Testing/Procedures: Your physician has requested that you have an echocardiogram. Echocardiography is a painless test that uses sound waves to create images of your heart. It provides your doctor with information about the size and shape of your heart and how well your heart's chambers and valves are working.   You may receive an ultrasound enhancing agent through an IV if needed to better visualize your heart during the echo. This procedure takes approximately one hour.  There are no restrictions for this procedure.  This will take place at [address] [facility] [address] [facility], [place] [address]  Please note: We ask at that you not bring children with you  during ultrasound (echo/ vascular) testing. Due to room size and safety concerns, children are not allowed in the ultrasound rooms during exams. Our front office staff cannot provide observation of children in our lobby area while testing is being conducted. An adult accompanying a patient to their appointment will only be allowed in the ultrasound room at the discretion of the ultrasound technician under special circumstances. We apologize for any inconvenience.   ZIO XT- Long Term Monitor Instructions  Your physician has requested you wear a ZIO patch monitor for 14 days.  This is a single patch monitor. [employer] supplies one patch monitor per enrollment. Additional stickers are not available. Please do not apply patch if you will be having a Nuclear Stress Test,  Echocardiogram, Cardiac CT, MRI, or Chest Xray during the period you would be wearing the  monitor. The patch cannot be worn during these tests. You cannot remove and re-apply the  ZIO XT patch monitor.  Your ZIO patch monitor will be mailed 3 day USPS to your address on file. It may take 3-5 days  to receive your monitor after you have been enrolled.  Once you have received your monitor, please review the enclosed instructions. Your monitor  has already been registered assigning a specific monitor serial # to you.  Billing and Patient Assistance Program Information  We have supplied [employer] with any of your insurance information on file for billing purposes. [employer] offers a sliding scale Patient Assistance Program for patients that do not have  insurance, or whose insurance does not completely cover the cost of the ZIO monitor.  You must apply for the Patient Assistance Program to qualify for this discounted rate.  To apply, please call [employer] at [phone], select option 4, select option 2, ask to apply for  Patient Assistance Program. Meredeth Ide will ask your household income, and how many people  are in your household. They  will quote your out-of-pocket cost based on that information.  [employer] will also be able to set up a 30-month, interest-free payment plan if needed.  Applying the monitor   Shave hair from upper left chest.  Hold abrader disc by orange tab. Rub abrader in 40 strokes over the upper left chest as  indicated in your monitor  instructions.  Clean area with 4 enclosed alcohol pads. Let dry.  Apply patch as indicated in monitor instructions. Patch will be placed under collarbone on left  side of chest with arrow pointing upward.  Rub patch adhesive wings for 2 minutes. Remove white label marked "1". Remove the white  label marked "2". Rub patch adhesive wings for 2 additional minutes.  While looking in a mirror, press and release button in center of patch. A small green light will  flash 3-4 times. This will be your only indicator that the monitor has been turned on.  Do not shower for the first 24 hours. You may shower after the first 24 hours.  Press the button if you feel a symptom. You will hear a small click. Record Date, Time and  Symptom in the Patient Logbook.  When you are ready to remove the patch, follow instructions on the last 2 pages of Patient  Logbook. Stick patch monitor onto the last page of Patient Logbook.  Place Patient Logbook in the blue and white box. Use locking tab on box and tape box closed  securely. The blue and white box has prepaid postage on it. Please place it in the mailbox as  soon as possible. Your physician should have your test results approximately 7 days after the  monitor has been mailed back to [facility].  Call [facility] Customer Care at [phone] if you have questions regarding  your ZIO XT patch monitor. Call them immediately if you see an orange light blinking on your  monitor.  If your monitor falls off in less than 4 days, contact our Monitor department at [phone].  If your monitor becomes loose or falls off after 4 days call  [employer] at [phone] for  suggestions on securing your monitor    Follow-Up: At [facility], you and your health needs are our priority.  As part of our continuing mission to provide you with exceptional heart care, we have created designated Provider Care Teams.  These Care Teams include your primary Cardiologist (physician) and Advanced Practice Providers (APPs -  Physician Assistants and Nurse Practitioners) who all work together to provide you with the care you need, when you need it.  We recommend signing up for the patient portal called "MyChart".  Sign up information is provided on this After Visit Summary.  MyChart is used to connect with patients for Virtual Visits (Telemedicine).  Patients are able to view lab/test results, encounter notes, upcoming appointments, etc.  Non-urgent messages can be sent to your provider as well.   To learn more about what you can do with MyChart, go to [email or web address].    Your next appointment:   2-3 month(s)  Provider:   You may see Dr. Azucena Cecil or one of the following Advanced Practice Providers on your designated Care Team:   Nicolasa Ducking, NP Eula Listen, PA-C Cadence Fransico Michael, PA-C Charlsie Quest, NP Carlos Levering, NP           Signed, Debbe Odea, MD  09/26/2023 11:38 AM    [facility]

## 2023-09-26 NOTE — Patient Instructions (Signed)
 Medication Instructions:   Your Physician recommend you continue on your current medication as directed.     *If you need a refill on your cardiac medications before your next appointment, please call your pharmacy*   Lab Work: None ordered If you have labs (blood work) drawn today and your tests are completely normal, you will receive your results only by: MyChart Message (if you have MyChart) OR A paper copy in the mail If you have any lab test that is abnormal or we need to change your treatment, we will call you to review the results.   Testing/Procedures: Your physician has requested that you have an echocardiogram. Echocardiography is a painless test that uses sound waves to create images of your heart. It provides your doctor with information about the size and shape of your heart and how well your heart's chambers and valves are working.   You may receive an ultrasound enhancing agent through an IV if needed to better visualize your heart during the echo. This procedure takes approximately one hour.  There are no restrictions for this procedure.  This will take place at [address] [facility]) [address], [place] [address]  Please note: We ask at that you not bring children with you during ultrasound (echo/ vascular) testing. Due to room size and safety concerns, children are not allowed in the ultrasound rooms during exams. Our front office staff cannot provide observation of children in our lobby area while testing is being conducted. An adult accompanying a patient to their appointment will only be allowed in the ultrasound room at the discretion of the ultrasound technician under special circumstances. We apologize for any inconvenience.   ZIO XT- Long Term Monitor Instructions  Your physician has requested you wear a ZIO patch monitor for 14 days.  This is a single patch monitor. [employer] supplies one patch monitor per enrollment. Additional stickers are not  available. Please do not apply patch if you will be having a Nuclear Stress Test,  Echocardiogram, Cardiac CT, MRI, or Chest Xray during the period you would be wearing the  monitor. The patch cannot be worn during these tests. You cannot remove and re-apply the  ZIO XT patch monitor.  Your ZIO patch monitor will be mailed 3 day USPS to your address on file. It may take 3-5 days  to receive your monitor after you have been enrolled.  Once you have received your monitor, please review the enclosed instructions. Your monitor  has already been registered assigning a specific monitor serial # to you.  Billing and Patient Assistance Program Information  We have supplied [employer] with any of your insurance information on file for billing purposes. [employer] offers a sliding scale Patient Assistance Program for patients that do not have  insurance, or whose insurance does not completely cover the cost of the ZIO monitor.  You must apply for the Patient Assistance Program to qualify for this discounted rate.  To apply, please call [employer] at [phone], select option 4, select option 2, ask to apply for  Patient Assistance Program. Meredeth Ide will ask your household income, and how many people  are in your household. They will quote your out-of-pocket cost based on that information.  [employer] will also be able to set up a 10-month, interest-free payment plan if needed.  Applying the monitor   Shave hair from upper left chest.  Hold abrader disc by orange tab. Rub abrader in 40 strokes over the upper left chest as  indicated  in your monitor instructions.  Clean area with 4 enclosed alcohol pads. Let dry.  Apply patch as indicated in monitor instructions. Patch will be placed under collarbone on left  side of chest with arrow pointing upward.  Rub patch adhesive wings for 2 minutes. Remove white label marked "1". Remove the white  label marked "2". Rub patch adhesive wings for 2 additional minutes.   While looking in a mirror, press and release button in center of patch. A small green light will  flash 3-4 times. This will be your only indicator that the monitor has been turned on.  Do not shower for the first 24 hours. You may shower after the first 24 hours.  Press the button if you feel a symptom. You will hear a small click. Record Date, Time and  Symptom in the Patient Logbook.  When you are ready to remove the patch, follow instructions on the last 2 pages of Patient  Logbook. Stick patch monitor onto the last page of Patient Logbook.  Place Patient Logbook in the blue and white box. Use locking tab on box and tape box closed  securely. The blue and white box has prepaid postage on it. Please place it in the mailbox as  soon as possible. Your physician should have your test results approximately 7 days after the  monitor has been mailed back to [facility].  Call [facility] Customer Care at [phone] if you have questions regarding  your ZIO XT patch monitor. Call them immediately if you see an orange light blinking on your  monitor.  If your monitor falls off in less than 4 days, contact our Monitor department at [phone].  If your monitor becomes loose or falls off after 4 days call [employer] at [phone] for  suggestions on securing your monitor    Follow-Up: At [facility], you and your health needs are our priority.  As part of our continuing mission to provide you with exceptional heart care, we have created designated Provider Care Teams.  These Care Teams include your primary Cardiologist (physician) and Advanced Practice Providers (APPs -  Physician Assistants and Nurse Practitioners) who all work together to provide you with the care you need, when you need it.  We recommend signing up for the patient portal called "MyChart".  Sign up information is provided on this After Visit Summary.  MyChart is used to connect with patients for Virtual  Visits (Telemedicine).  Patients are able to view lab/test results, encounter notes, upcoming appointments, etc.  Non-urgent messages can be sent to your provider as well.   To learn more about what you can do with MyChart, go to [email or web address].    Your next appointment:   2-3 month(s)  Provider:   You may see Dr. Azucena Cecil or one of the following Advanced Practice Providers on your designated Care Team:   Nicolasa Ducking, NP Eula Listen, PA-C Cadence Fransico Michael, PA-C Charlsie Quest, NP Carlos Levering, NP

## 2023-10-19 DIAGNOSIS — R002 Palpitations: Secondary | ICD-10-CM | POA: Diagnosis not present

## 2023-10-22 ENCOUNTER — Ambulatory Visit

## 2023-10-22 ENCOUNTER — Telehealth: Payer: Self-pay | Admitting: Cardiology

## 2023-10-22 DIAGNOSIS — R002 Palpitations: Secondary | ICD-10-CM

## 2023-10-22 NOTE — Telephone Encounter (Signed)
 Patient was calling to speak to you about the mychart message. Please advise

## 2023-10-22 NOTE — Telephone Encounter (Signed)
 Called patient regarding return of phone call. Pt expressed she rescheduled echocardiogram appt for May and has a follow up appointment in June and was wondering based on her zio findings, can she push these tests off and wants to know what recommendations Dr. Junnie Olives has. She explained she had a high co-pay at her last visit and was trying to prevent having to pay that every time. If the dr believes her svt and palpitations based on zio are not urgent at this time she would like to defer to a later date. She has no complaints at this time and says she has not had palpitations in a few days, only when working out or walking up the stairs is when she really notices.

## 2023-11-21 ENCOUNTER — Other Ambulatory Visit

## 2023-11-22 ENCOUNTER — Telehealth: Payer: Self-pay | Admitting: Cardiology

## 2023-11-22 DIAGNOSIS — I340 Nonrheumatic mitral (valve) insufficiency: Secondary | ICD-10-CM

## 2023-11-22 NOTE — Telephone Encounter (Signed)
 Spoke with the patient and informed her that Dr. Junnie Olives still recommends proceeding with the ECHO. The patient verbalized understanding, and a message was sent to scheduling.

## 2023-11-22 NOTE — Telephone Encounter (Signed)
 New message:       Patients wants to find out from Dr Junnie Olives if she still need to have the Echo?

## 2023-12-13 ENCOUNTER — Ambulatory Visit: Attending: Cardiology

## 2023-12-13 DIAGNOSIS — I340 Nonrheumatic mitral (valve) insufficiency: Secondary | ICD-10-CM

## 2023-12-13 LAB — ECHOCARDIOGRAM COMPLETE
AR max vel: 2.45 cm2
AV Area VTI: 2.44 cm2
AV Area mean vel: 2.48 cm2
AV Mean grad: 3 mmHg
AV Peak grad: 5.1 mmHg
Ao pk vel: 1.13 m/s
Area-P 1/2: 4.6 cm2
Calc EF: 52.1 %
S' Lateral: 3 cm
Single Plane A2C EF: 52.2 %
Single Plane A4C EF: 53.8 %

## 2023-12-14 ENCOUNTER — Ambulatory Visit: Payer: Self-pay | Admitting: Cardiology

## 2023-12-26 ENCOUNTER — Ambulatory Visit: Admitting: Cardiology

## 2024-01-30 ENCOUNTER — Ambulatory Visit
Admission: RE | Admit: 2024-01-30 | Discharge: 2024-01-30 | Disposition: A | Discharge status: Home / Self Care | Payer: BC Managed Care – PPO | Source: Ambulatory Visit | Attending: Family Medicine | Admitting: Family Medicine

## 2024-01-30 DIAGNOSIS — Z1231 Encounter for screening mammogram for malignant neoplasm of breast: Secondary | ICD-10-CM | POA: Diagnosis not present

## 2024-01-30 DIAGNOSIS — Z0000 Encounter for general adult medical examination without abnormal findings: Secondary | ICD-10-CM

## 2024-04-21 NOTE — Patient Instructions (Signed)
 Preventive Care 52-52 Years Old, Female  Preventive care refers to lifestyle choices and visits with your health care provider that can promote health and wellness. Preventive care visits are also called wellness exams.  What can I expect for my preventive care visit?  Counseling  Your health care provider may ask you questions about your:  Medical history, including:  Past medical problems.  Family medical history.  Pregnancy history.  Current health, including:  Menstrual cycle.  Method of birth control.  Emotional well-being.  Home life and relationship well-being.  Sexual activity and sexual health.  Lifestyle, including:  Alcohol, nicotine or tobacco, and drug use.  Access to firearms.  Diet, exercise, and sleep habits.  Work and work Astronomer.  Sunscreen use.  Safety issues such as seatbelt and bike helmet use.  Physical exam  Your health care provider will check your:  Height and weight. These may be used to calculate your BMI (body mass index). BMI is a measurement that tells if you are at a healthy weight.  Waist circumference. This measures the distance around your waistline. This measurement also tells if you are at a healthy weight and may help predict your risk of certain diseases, such as type 2 diabetes and high blood pressure.  Heart rate and blood pressure.  Body temperature.  Skin for abnormal spots.  What immunizations do I need?    Vaccines are usually given at various ages, according to a schedule. Your health care provider will recommend vaccines for you based on your age, medical history, and lifestyle or other factors, such as travel or where you work.  What tests do I need?  Screening  Your health care provider may recommend screening tests for certain conditions. This may include:  Lipid and cholesterol levels.  Diabetes screening. This is done by checking your blood sugar (glucose) after you have not eaten for a while (fasting).  Pelvic exam and Pap test.  Hepatitis B test.  Hepatitis C  test.  HIV (human immunodeficiency virus) test.  STI (sexually transmitted infection) testing, if you are at risk.  Lung cancer screening.  Colorectal cancer screening.  Mammogram. Talk with your health care provider about when you should start having regular mammograms. This may depend on whether you have a family history of breast cancer.  BRCA-related cancer screening. This may be done if you have a family history of breast, ovarian, tubal, or peritoneal cancers.  Bone density scan. This is done to screen for osteoporosis.  Talk with your health care provider about your test results, treatment options, and if necessary, the need for more tests.  Follow these instructions at home:  Eating and drinking    Eat a diet that includes fresh fruits and vegetables, whole grains, lean protein, and low-fat dairy products.  Take vitamin and mineral supplements as recommended by your health care provider.  Do not drink alcohol if:  Your health care provider tells you not to drink.  You are pregnant, may be pregnant, or are planning to become pregnant.  If you drink alcohol:  Limit how much you have to 0-1 drink a day.  Know how much alcohol is in your drink. In the [place] one drink equals one 12 oz bottle of beer (355 mL), one 5 oz glass of wine (148 mL), or one 1 oz glass of hard liquor (44 mL).  Lifestyle  Brush your teeth every morning and night with fluoride toothpaste. Floss one time each day.  Exercise for at least  30 minutes 5 or more days each week.  Do not use any products that contain nicotine or tobacco. These products include cigarettes, chewing tobacco, and vaping devices, such as e-cigarettes. If you need help quitting, ask your health care provider.  Do not use drugs.  If you are sexually active, practice safe sex. Use a condom or other form of protection to prevent STIs.  If you do not wish to become pregnant, use a form of birth control. If you plan to become pregnant, see your health care provider for a  prepregnancy visit.  Take aspirin only as told by your health care provider. Make sure that you understand how much to take and what form to take. Work with your health care provider to find out whether it is safe and beneficial for you to take aspirin daily.  Find healthy ways to manage stress, such as:  Meditation, yoga, or listening to music.  Journaling.  Talking to a trusted person.  Spending time with friends and family.  Minimize exposure to UV radiation to reduce your risk of skin cancer.  Safety  Always wear your seat belt while driving or riding in a vehicle.  Do not drive:  If you have been drinking alcohol. Do not ride with someone who has been drinking.  When you are tired or distracted.  While texting.  If you have been using any mind-altering substances or drugs.  Wear a helmet and other protective equipment during sports activities.  If you have firearms in your house, make sure you follow all gun safety procedures.  Seek help if you have been physically or sexually abused.  What's next?  Visit your health care provider once a year for an annual wellness visit.  Ask your health care provider how often you should have your eyes and teeth checked.  Stay up to date on all vaccines.  This information is not intended to replace advice given to you by your health care provider. Make sure you discuss any questions you have with your health care provider.  Document Revised: 12/15/2020 Document Reviewed: 12/15/2020  Elsevier Patient Education  2024 [employer].

## 2024-04-22 ENCOUNTER — Encounter: Payer: Self-pay | Admitting: Family Medicine

## 2024-04-22 ENCOUNTER — Ambulatory Visit (INDEPENDENT_AMBULATORY_CARE_PROVIDER_SITE_OTHER): Payer: Self-pay | Admitting: Family Medicine

## 2024-04-22 VITALS — BP 110/70 | HR 98 | Resp 16 | Ht 70.0 in | Wt 152.7 lb

## 2024-04-22 DIAGNOSIS — Z13220 Encounter for screening for lipoid disorders: Secondary | ICD-10-CM | POA: Diagnosis not present

## 2024-04-22 DIAGNOSIS — Z0000 Encounter for general adult medical examination without abnormal findings: Secondary | ICD-10-CM | POA: Diagnosis not present

## 2024-04-22 DIAGNOSIS — Z131 Encounter for screening for diabetes mellitus: Secondary | ICD-10-CM

## 2024-04-22 DIAGNOSIS — E559 Vitamin D deficiency, unspecified: Secondary | ICD-10-CM

## 2024-04-22 DIAGNOSIS — Z1159 Encounter for screening for other viral diseases: Secondary | ICD-10-CM

## 2024-04-22 DIAGNOSIS — Z862 Personal history of diseases of the blood and blood-forming organs and certain disorders involving the immune mechanism: Secondary | ICD-10-CM | POA: Diagnosis not present

## 2024-04-22 DIAGNOSIS — Z8349 Family history of other endocrine, nutritional and metabolic diseases: Secondary | ICD-10-CM

## 2024-04-22 DIAGNOSIS — Z1231 Encounter for screening mammogram for malignant neoplasm of breast: Secondary | ICD-10-CM

## 2024-04-22 NOTE — Progress Notes (Signed)
 Name: Cella Cappello   MRN: [id number]    DOB: 1971-04-15   Date:04/22/2024       Progress Note  Subjective  Chief Complaint  Chief Complaint  Patient presents with   Annual Exam    HPI  Patient presents for annual CPE.  Diet: she has decreased carbohydrate intake and lost about 11 lbs since March  Exercise: she is currently not exercising, she states over the past two she has been busy but will resume going to gym for 30 minutes 5 days a week   Last Eye Exam: completed Last Dental Exam: completed  Flowsheet Row Office Visit from 04/22/20[facility] in [facility]  AUDIT-C Score 0   Depression: Phq 9 is  negative    04/22/2024    7:51 AM 08/03/2023   10:56 AM 04/17/2023    8:04 AM 04/13/2022    8:30 AM 01/11/2021    1:03 PM  Depression screen PHQ 2/9  Decreased Interest 0 0 0 0 0  Down, Depressed, Hopeless 0 0 0 0 0  PHQ - 2 Score 0 0 0 0 0  Altered sleeping  0 0 0   Tired, decreased energy  0 0 0   Change in appetite  0 0 0   Feeling bad or failure about yourself   0 0 0   Trouble concentrating  0 0 0   Moving slowly or fidgety/restless  0 0 0   Suicidal thoughts  0 0 0   PHQ-9 Score  0 0 0   Difficult doing work/chores  Not difficult at all      Hypertension: BP Readings from Last 3 Encounters:  04/22/24 110/70  09/26/23 110/70  08/03/23 118/68   Obesity: Wt Readings from Last 3 Encounters:  04/22/24 152 lb 11.2 oz (69.3 kg)  09/26/23 163 lb (73.9 kg)  08/03/23 163 lb 3.2 oz (74 kg)   BMI Readings from Last 3 Encounters:  04/22/24 21.91 kg/m  09/26/23 23.39 kg/m  08/03/23 23.75 kg/m     Vaccines: reviewed with the patient. She will get PCV 20 and flu at local pharmacy   Hep C Screening: completed STD testing and prevention (HIV/chl/gon/syphilis): Not interested  Intimate partner violence: negative screen  Sexual History : not sexually active  Menstrual History/LMP/Abnormal Bleeding: s/p hysterectomy  Discussed importance of  follow up if any post-menopausal bleeding: not applicable  Incontinence Symptoms: negative for symptoms   Breast cancer:  - Last Mammogram: up to date  - BRCA gene screening: N/A  Osteoporosis Prevention : Discussed high calcium and vitamin D  supplementation, weight bearing exercises Bone density :not applicable   Cervical cancer screening: up-to-date  Skin cancer: Discussed monitoring for atypical lesions  Colorectal cancer: up to date    Lung cancer:  Low Dose CT Chest recommended if Age 18-80 years, 20 pack-year currently smoking OR have quit w/in 15years. Patient does not qualify for screen   ECG: sees cardiologist   Advanced Care Planning: A voluntary discussion about advance care planning including the explanation and discussion of advance directives.  Discussed health care proxy and Living will, and the patient was able to identify a health care proxy as mother .  Patient does not have a living will and power of attorney of health care   Patient Active Problem List   Diagnosis Date Noted   Screening for colon cancer 07/25/2022   Adenomatous polyp of ascending colon 07/25/2022   History of nephrolithiasis 07/22/2016   S/P  hysterectomy 05/18/2015   Eyelid cyst 04/26/2015    Past Surgical History:  Procedure Laterality Date   ABDOMINAL HYSTERECTOMY  05/18/2015   COLONOSCOPY     COLONOSCOPY WITH PROPOFOL  N/A 07/25/2022   Procedure: COLONOSCOPY WITH PROPOFOL ;  Surgeon: Therisa Bi, MD;  [facility] ENDOSCOPY;  Service: Gastroenterology;  Laterality: N/A;   LAPAROSCOPIC HYSTERECTOMY N/A 05/18/2015   Procedure: HYSTERECTOMY TOTAL LAPAROSCOPIC/BILATERAL SALPINGECTOMY;  Surgeon: Lamar SHAUNNA Lesches, MD;  Location: [facility] ORS;  Service: Gynecology;  Laterality: N/A;   WISDOM TOOTH EXTRACTION Bilateral     Family History  Problem Relation Age of Onset   Glaucoma Mother    Hypothyroidism Mother    Healthy Father    Healthy Sister    Prostate cancer Paternal Uncle        x 2    Breast cancer Cousin 66   Colon cancer Cousin 45   Kidney cancer Neg Hx    Bladder Cancer Neg Hx     Social History   Socioeconomic History   Marital status: Single    Spouse name: Not on file   Number of children: 0   Years of education: Not on file   Highest education level: Master's degree (e.g., MA, MS, MEng, MEd, MSW, MBA)  Occupational History   Occupation: Art gallery manager   Tobacco Use   Smoking status: Never   Smokeless tobacco: Never  Vaping Use   Vaping status: Never Used  Substance and Sexual Activity   Alcohol use: Yes    Comment: very rare   Drug use: No   Sexual activity: Not Currently    Partners: Male  Other Topics Concern   Not on file  Social History Narrative   Never married, no children   Social Drivers of Corporate investment banker Strain: Low Risk  (04/22/2024)   Overall Financial Resource Strain (CARDIA)    Difficulty of Paying Living Expenses: Not hard at all  Food Insecurity: No Food Insecurity (04/22/2024)   Hunger Vital Sign    Worried About Running Out of Food in the Last Year: Never true    Ran Out of Food in the Last Year: Never true  Transportation Needs: No Transportation Needs (04/22/2024)   PRAPARE - Administrator, Civil Service (Medical): No    Lack of Transportation (Non-Medical): No  Physical Activity: Sufficiently Active (04/22/2024)   Exercise Vital Sign    Days of Exercise per Week: 5 days    Minutes of Exercise per Session: 30 min  Stress: No Stress Concern Present (04/22/2024)   [employer] of Occupational Health - Occupational Stress Questionnaire    Feeling of Stress: Not at all  Social Connections: Socially Isolated (04/22/2024)   Social Connection and Isolation Panel    Frequency of Communication with Friends and Family: Once a week    Frequency of Social Gatherings with Friends and Family: Once a week    Attends Religious Services: Never    Database administrator or Organizations: No    Attends Occupational hygienist Meetings: Never    Marital Status: Never married  Intimate Partner Violence: Not At Risk (04/22/2024)   Humiliation, Afraid, Rape, and Kick questionnaire    Fear of Current or Ex-Partner: No    Emotionally Abused: No    Physically Abused: No    Sexually Abused: No     Current Outpatient Medications:    Multiple Vitamins-Minerals (MULTIVITAMIN WOMEN) TABS, Take by mouth., Disp: , Rfl:    Vitamin D , Cholecalciferol,  1000 UNITS TABS, Take by mouth., Disp: , Rfl:   No Known Allergies   ROS  Constitutional: Negative for fever or weight change.  Respiratory: Negative for cough and shortness of breath.   Cardiovascular: Negative for chest pain or palpitations.  Gastrointestinal: Negative for abdominal pain, no bowel changes.  Musculoskeletal: Negative for gait problem or joint swelling.  Skin: Negative for rash.  Neurological: Negative for dizziness or headache.  No other specific complaints in a complete review of systems (except as listed in HPI above).   Objective  Vitals:   04/22/24 0758  BP: 110/70  Pulse: 98  Resp: 16  SpO2: 100%  Weight: 152 lb 11.2 oz (69.3 kg)  Height: 5' 10 (1.778 m)    Body mass index is 21.91 kg/m.  Physical Exam  Constitutional: Patient appears well-developed and well-nourished. No distress.  HENT: Head: Normocephalic and atraumatic. Ears: B TMs ok, no erythema or effusion; Nose: Nose normal. Mouth/Throat: Oropharynx is clear and moist. No oropharyngeal exudate.  Eyes: Conjunctivae and EOM are normal. Pupils are equal, round, and reactive to light. No scleral icterus.  Neck: Normal range of motion. Neck supple. No JVD present. No thyromegaly present.  Cardiovascular: Normal rate, regular rhythm and normal heart sounds.  No murmur heard. No BLE edema. Pulmonary/Chest: Effort normal and breath sounds normal. No respiratory distress. Abdominal: Soft. Bowel sounds are normal, no distension. There is no tenderness. no  masses Breast: no lumps or masses, no nipple discharge or rashes FEMALE GENITALIA:  External genitalia normal External urethra normal Vaginal vault normal without discharge or lesions Cervix normal without discharge or lesions Bimanual exam normal without masses RECTAL: not done  Musculoskeletal: Normal range of motion, no joint effusions. No gross deformities Neurological: he is alert and oriented to person, place, and time. No cranial nerve deficit. Coordination, balance, strength, speech and gait are normal.  Skin: Skin is warm and dry. No rash noted. No erythema. Some dark moles, one on back and one on abdomen, patient prefers not seeing dermatologist at this time Psychiatric: Patient has a normal mood and affect. behavior is normal. Judgment and thought content normal.     Assessment & Plan  1. Well adult exam (Primary)  - Hepatitis B Surface AntiBODY - CBC with Differential/Platelet - Comprehensive metabolic panel with GFR - TSH - Lipid panel - VITAMIN D  25 Hydroxy (Vit-D Deficiency, Fractures) - Hemoglobin A1c  2. Vitamin D  deficiency  - VITAMIN D  25 Hydroxy (Vit-D Deficiency, Fractures)  3. Family history of thyroid  disease  - TSH  4. Lipid screening  - Lipid panel  5. Diabetes mellitus screening  - Hemoglobin A1c  6. History of anemia  - CBC with Differential/Platelet  7. Need for hepatitis B screening test  - Hepatitis B Surface AntiBODY   -USPSTF grade A and B recommendations reviewed with patient; age-appropriate recommendations, preventive care, screening tests, etc discussed and encouraged; healthy living encouraged; see AVS for patient education given to patient -Discussed importance of 150 minutes of physical activity weekly, eat two servings of fish weekly, eat one serving of tree nuts ( cashews, pistachios, pecans, almonds.SABRA) every other day, eat 6 servings of fruit/vegetables daily and drink plenty of water and avoid sweet beverages.    -Reviewed Health Maintenance: Yes.

## 2024-04-23 ENCOUNTER — Ambulatory Visit: Payer: Self-pay | Admitting: Family Medicine

## 2024-04-23 LAB — COMPREHENSIVE METABOLIC PANEL WITH GFR
AG Ratio: 1.8 (calc) (ref 1.0–2.5)
ALT: 17 U/L (ref 6–29)
AST: 18 U/L (ref 10–35)
Albumin: 5 g/dL (ref 3.6–5.1)
Alkaline phosphatase (APISO): 58 U/L (ref 37–153)
BUN: 12 mg/dL (ref 7–25)
CO2: 28 mmol/L (ref 20–32)
Calcium: 9.6 mg/dL (ref 8.6–10.4)
Chloride: 102 mmol/L (ref 98–110)
Creat: 0.69 mg/dL (ref 0.50–1.03)
Globulin: 2.8 g/dL (ref 1.9–3.7)
Glucose, Bld: 82 mg/dL (ref 65–99)
Potassium: 4.8 mmol/L (ref 3.5–5.3)
Sodium: 139 mmol/L (ref 135–146)
Total Bilirubin: 0.6 mg/dL (ref 0.2–1.2)
Total Protein: 7.8 g/dL (ref 6.1–8.1)
eGFR: 105 mL/min/1.73m2 (ref >=60)

## 2024-04-23 LAB — CBC WITH DIFFERENTIAL/PLATELET
Absolute Lymphocytes: 915 {cells}/uL (ref 850–3900)
Absolute Monocytes: 405 {cells}/uL (ref 200–950)
Basophils Absolute: 9 {cells}/uL (ref 0–200)
Basophils Relative: 0.2 %
Eosinophils Absolute: 101 {cells}/uL (ref 15–500)
Eosinophils Relative: 2.3 %
HCT: 44 % (ref 35.0–45.0)
Hemoglobin: 13.8 g/dL (ref 11.7–15.5)
MCH: 26.7 pg — ABNORMAL LOW (ref 27.0–33.0)
MCHC: 31.4 g/dL — ABNORMAL LOW (ref 32.0–36.0)
MCV: 85.3 fL (ref 80.0–100.0)
MPV: 11.8 fL (ref 7.5–12.5)
Monocytes Relative: 9.2 %
Neutro Abs: 2970 {cells}/uL (ref 1500–7800)
Neutrophils Relative %: 67.5 %
Platelets: 196 Thousand/uL (ref 140–400)
RBC: 5.16 Million/uL — ABNORMAL HIGH (ref 3.80–5.10)
RDW: 12.5 % (ref 11.0–15.0)
Total Lymphocyte: 20.8 %
WBC: 4.4 Thousand/uL (ref 3.8–10.8)

## 2024-04-23 LAB — LIPID PANEL
Cholesterol: 167 mg/dL (ref <200)
HDL: 55 mg/dL (ref >=50)
LDL Cholesterol (Calc): 97 mg/dL
Non-HDL Cholesterol (Calc): 112 mg/dL (ref <130)
Total CHOL/HDL Ratio: 3 (calc) (ref <5.0)
Triglycerides: 66 mg/dL (ref <150)

## 2024-04-23 LAB — HEMOGLOBIN A1C
Hgb A1c MFr Bld: 5.4 % (ref <5.7)
Mean Plasma Glucose: 108 mg/dL
eAG (mmol/L): 6 mmol/L

## 2024-04-23 LAB — HEPATITIS B SURFACE ANTIBODY,QUALITATIVE: Hep B S Ab: REACTIVE — AB

## 2024-04-23 LAB — TSH: TSH: 0.86 m[IU]/L

## 2024-04-23 LAB — VITAMIN D 25 HYDROXY (VIT D DEFICIENCY, FRACTURES): Vit D, 25-Hydroxy: 40 ng/mL (ref 30–100)

## 2024-04-25 ENCOUNTER — Encounter: Payer: Self-pay | Admitting: Family Medicine

## 2025-04-24 ENCOUNTER — Encounter: Admitting: Family Medicine
